# Patient Record
Sex: Male | Born: 1955
Health system: Southern US, Community
[De-identification: ages and names within clinical notes are randomized; demographics above are authoritative.]

## PROBLEM LIST (undated history)

## (undated) DIAGNOSIS — E119 Type 2 diabetes mellitus without complications: Secondary | ICD-10-CM

## (undated) DIAGNOSIS — I10 Essential (primary) hypertension: Secondary | ICD-10-CM

## (undated) DIAGNOSIS — G4733 Obstructive sleep apnea (adult) (pediatric): Secondary | ICD-10-CM

## (undated) HISTORY — PX: ELBOW SURGERY: SHX618

## (undated) HISTORY — PX: EYE SURGERY: SHX253

## (undated) HISTORY — PX: SPINE SURGERY: SHX786

---

## 2004-06-27 ENCOUNTER — Ambulatory Visit: Payer: Self-pay | Admitting: Internal Medicine

## 2008-12-07 ENCOUNTER — Ambulatory Visit: Payer: Self-pay | Admitting: Gastroenterology

## 2009-01-03 ENCOUNTER — Encounter: Payer: Self-pay | Admitting: Internal Medicine

## 2009-02-01 ENCOUNTER — Encounter: Payer: Self-pay | Admitting: Internal Medicine

## 2011-05-22 ENCOUNTER — Ambulatory Visit: Payer: Self-pay | Admitting: Internal Medicine

## 2011-11-20 ENCOUNTER — Ambulatory Visit: Payer: Self-pay | Admitting: Gastroenterology

## 2011-12-21 ENCOUNTER — Emergency Department: Payer: Self-pay | Admitting: Emergency Medicine

## 2015-11-02 DIAGNOSIS — F329 Major depressive disorder, single episode, unspecified: Secondary | ICD-10-CM | POA: Insufficient documentation

## 2015-11-02 DIAGNOSIS — F32A Depression, unspecified: Secondary | ICD-10-CM | POA: Insufficient documentation

## 2015-11-02 DIAGNOSIS — Z6841 Body Mass Index (BMI) 40.0 and over, adult: Secondary | ICD-10-CM

## 2015-11-08 DIAGNOSIS — E782 Mixed hyperlipidemia: Secondary | ICD-10-CM | POA: Insufficient documentation

## 2015-11-08 DIAGNOSIS — E119 Type 2 diabetes mellitus without complications: Secondary | ICD-10-CM | POA: Insufficient documentation

## 2015-11-30 DIAGNOSIS — S92143A Displaced dome fracture of unspecified talus, initial encounter for closed fracture: Secondary | ICD-10-CM | POA: Insufficient documentation

## 2015-11-30 DIAGNOSIS — S82892P Other fracture of left lower leg, subsequent encounter for closed fracture with malunion: Secondary | ICD-10-CM | POA: Insufficient documentation

## 2016-05-01 DIAGNOSIS — I10 Essential (primary) hypertension: Secondary | ICD-10-CM | POA: Insufficient documentation

## 2016-06-23 ENCOUNTER — Emergency Department
Admission: EM | Admit: 2016-06-23 | Discharge: 2016-06-23 | Disposition: A | Discharge status: Home / Self Care | Payer: Self-pay | Attending: Emergency Medicine | Admitting: Emergency Medicine

## 2016-06-23 ENCOUNTER — Encounter: Payer: Self-pay | Admitting: Emergency Medicine

## 2016-06-23 ENCOUNTER — Emergency Department: Payer: Self-pay

## 2016-06-23 DIAGNOSIS — I10 Essential (primary) hypertension: Secondary | ICD-10-CM | POA: Insufficient documentation

## 2016-06-23 DIAGNOSIS — Y929 Unspecified place or not applicable: Secondary | ICD-10-CM | POA: Insufficient documentation

## 2016-06-23 DIAGNOSIS — E119 Type 2 diabetes mellitus without complications: Secondary | ICD-10-CM | POA: Insufficient documentation

## 2016-06-23 DIAGNOSIS — Y9389 Activity, other specified: Secondary | ICD-10-CM | POA: Insufficient documentation

## 2016-06-23 DIAGNOSIS — S8391XA Sprain of unspecified site of right knee, initial encounter: Secondary | ICD-10-CM | POA: Insufficient documentation

## 2016-06-23 DIAGNOSIS — Y999 Unspecified external cause status: Secondary | ICD-10-CM | POA: Insufficient documentation

## 2016-06-23 DIAGNOSIS — W1789XA Other fall from one level to another, initial encounter: Secondary | ICD-10-CM | POA: Insufficient documentation

## 2016-06-23 HISTORY — DX: Essential (primary) hypertension: I10

## 2016-06-23 HISTORY — DX: Obstructive sleep apnea (adult) (pediatric): G47.33

## 2016-06-23 HISTORY — DX: Type 2 diabetes mellitus without complications: E11.9

## 2016-06-23 MED ORDER — MELOXICAM 15 MG PO TABS: 15.0000 mg | ORAL_TABLET | Freq: Every day | ORAL | 0 refills | Status: DC

## 2016-06-23 NOTE — ED Provider Notes (Signed)
[facility] Emergency Department Provider Note ____________________________________________  Time seen: Approximately 3:03 PM  I have reviewed the triage vital signs and the nursing notes.   HISTORY  Chief Complaint Knee Injury    HPI Adam Grant is a 60 y.o. male who presents to the emergency department for evaluation of right knee pain after a mechanical, non-syncopal fall yesterday while getting off of a lift truck. He states that he fell and both of his knees were bent underneath him. The right knee is most painful. He states that the pain has worsened over the past 24 hours. He has not taken anything for pain relief prior to arrival.  Past Medical History:  Diagnosis Date  . Diabetes mellitus without complication ([place])   . Hypertension   . Obstructive sleep apnea     There are no active problems to display for this patient.   History reviewed. No pertinent surgical history.  Prior to Admission medications   Medication Sig Start Date End Date Taking? Authorizing Provider  meloxicam (MOBIC) 15 MG tablet Take 1 tablet (15 mg total) by mouth daily. 06/23/16   Victorino Dike, FNP    Allergies Losartan; Penicillins; and Tetracyclines & related  History reviewed. No pertinent family history.  Social History Social History  Substance Use Topics  . Smoking status: Never Smoker  . Smokeless tobacco: Never Used  . Alcohol use Yes     Comment: ocass.     Review of Systems Constitutional: No recent illness. Cardiovascular: Denies chest pain or palpitations. Respiratory: Denies shortness of breath. Musculoskeletal: Pain in Right knee Skin: Negative for rash, wound, lesion. Neurological: Negative for focal weakness or numbness.  ____________________________________________   PHYSICAL EXAM:  VITAL SIGNS: ED Triage Vitals  Enc Vitals Group     BP 06/23/16 1431 132/68     Pulse Rate 06/23/16 1431 88     Resp 06/23/16 1431 16     Temp  06/23/16 1431 98.1 F (36.7 C)     Temp Source 06/23/16 1431 Oral     SpO2 06/23/16 1431 96 %     Weight 06/23/16 1432 (!) 314 lb (142.4 kg)     Height 06/23/16 1432 5\' 11"  (1.803 m)     Head Circumference --      Peak Flow --      Pain Score 06/23/16 1432 2     Pain Loc --      Pain Edu? --      Excl. in [place]? --     Constitutional: Alert and oriented. Well appearing and in no acute distress. Eyes: Conjunctivae are normal. EOMI. Head: Atraumatic. Neck: No stridor.  Respiratory: Normal respiratory effort.   Musculoskeletal: Full, active range of motion of the right knee. Mild edema diffusely over the knee without erythema or fluctuance. No crepitus on flexion or extension. No obvious deformity of the patella. He is able to perform straight leg raise without assistance. Neurologic:  Normal speech and language. No gross focal neurologic deficits are appreciated. Speech is normal. No gait instability. Skin:  Skin is warm, dry and intact. Atraumatic. Psychiatric: Mood and affect are normal. Speech and behavior are normal.  ____________________________________________   LABS (all labs ordered are listed, but only abnormal results are displayed)  Labs Reviewed - No data to display ____________________________________________  RADIOLOGY  Right knee x-ray negative for acute bony abnormality per radiology. ____________________________________________   PROCEDURES  Procedure(s) performed: None   ____________________________________________   INITIAL IMPRESSION / ASSESSMENT AND  PLAN / ED COURSE  Clinical Course    Pertinent labs & imaging results that were available during my care of the patient were reviewed by me and considered in my medical decision making (see chart for details).  Patient was encouraged to follow up with orthopedics for symptoms that are not improving over the week. He was instructed to take the meloxicam daily as prescribed. He was advised to rest, ice,  and elevate the right lower extremity throughout the day. He was advised to return to the emergency department for symptoms that change or worsen if he is unable to schedule an appointment. ____________________________________________   FINAL CLINICAL IMPRESSION(S) / ED DIAGNOSES  Final diagnoses:  Sprain of right knee, unspecified ligament, initial encounter       Victorino Dike, FNP 06/23/16 [place], MD 06/23/16 [id number]

## 2016-06-23 NOTE — ED Triage Notes (Signed)
Pt states he was getting out of a pickup truck and fell to his knees. C/o right knee pain worse when walking.

## 2016-06-23 NOTE — ED Notes (Signed)
Pt informed to follow up with ortho and to take medications.  Pt also informed by provider to obtain a knee sleeve or brace for additional support and comfort.

## 2016-06-23 NOTE — ED Notes (Signed)
Patient states he fell on a lift truck yesterday and c/o right knee pain. Pt states he also has a broken left ankle that is chronic that he has not been able to receive surgery for due to lack of insurance. Pt has extensive medical history which includes diabetes and venous stasis.  Pt having increased difficulty ambulating with knee pain.

## 2016-06-23 NOTE — Discharge Instructions (Signed)
Follow-up with orthopedics for symptoms that are not improving over the week. Rest, ice, and elevate your right leg often throughout the day. You may find it helpful to apply a knee brace or neoprene sleeve.   Return to the emergency department for symptoms that change or worsen if you're unable schedule an appointment with the primary care provider or the orthopedic specialist.

## 2016-11-05 DIAGNOSIS — M21072 Valgus deformity, not elsewhere classified, left ankle: Secondary | ICD-10-CM | POA: Insufficient documentation

## 2016-11-06 ENCOUNTER — Encounter: Payer: Medicare HMO | Attending: Family Medicine | Admitting: Dietician

## 2016-11-06 ENCOUNTER — Encounter: Payer: Self-pay | Admitting: Dietician

## 2016-11-06 VITALS — Ht 71.0 in | Wt 313.0 lb

## 2016-11-06 DIAGNOSIS — E119 Type 2 diabetes mellitus without complications: Secondary | ICD-10-CM | POA: Insufficient documentation

## 2016-11-06 DIAGNOSIS — IMO0001 Reserved for inherently not codable concepts without codable children: Secondary | ICD-10-CM

## 2016-11-06 DIAGNOSIS — D126 Benign neoplasm of colon, unspecified: Secondary | ICD-10-CM | POA: Insufficient documentation

## 2016-11-06 DIAGNOSIS — Z713 Dietary counseling and surveillance: Secondary | ICD-10-CM | POA: Insufficient documentation

## 2016-11-06 DIAGNOSIS — I82409 Acute embolism and thrombosis of unspecified deep veins of unspecified lower extremity: Secondary | ICD-10-CM | POA: Insufficient documentation

## 2016-11-06 NOTE — Patient Instructions (Signed)
   Eat smaller food portions, especially of starches and meats. Eat generous portions of vegetables.   Eat something every 3-5 hours during the day.   Start some light chair exercises 1 or more times during the day, such as arm lifts, air-punches, leg lifts to start building strength so you can at some point add pool exercise or other exercise.

## 2016-11-06 NOTE — Progress Notes (Signed)
Medical Nutrition Therapy: Visit start time: 1100  end time: 1200  Assessment:  Diagnosis: obesity Past medical history: Type 2 DM, HTN, HLD, sleep apnea, DVT Psychosocial issues/ stress concerns: history of depression, not currently.  Preferred learning method:  . Auditory . Visual . Hands-on  Current weight: 313lbs per patient report; declined weighing today  Height: 5'11" Medications, supplements: reconciled list in medical record.   Progress and evaluation: Patient reports long history of obesity, has worked to lose weight in the past by reducing some food portions and sugar. Reports some issue with constipation, uses ex-lax if no BM in 2-3 days. He wants to lose weight to improve mobility and decrease reliance on others for assistance. Currently patient's son and sometimes ex-wife will prepare supper meals for him.    Physical activity: none, limited mobility  Dietary Intake:  Usual eating pattern includes 2-3 meals and 0-2 snacks per day. Dining out frequency: 5-6 meals per week.  Breakfast: fruit 2 apples or oranges or 2-3 bananas, 2 protein bars, 3 yogurt cups, 2 breakfast on a stick Snack: none Lunch: fast food maybe 2x a week; chinese; burrito, sometimes no lunch depending on when breakfast was eaten.  Snack: occasionally saltines or tollhouse cookies, rarely soup Supper: sometimes as late as 8-9pm (others bring food to patient)--chicken, chicken fingers, sometimes pizza; limits breads. Sometimes dry cereal with lowfat milk. Some salads Snack: none or nuts Beverages: water, limits alcohol, has consumed some sports drinks such as propel  Nutrition Care Education: Topics covered: weight management, diabetes Basic nutrition: basic food groups, appropriate nutrient balance, appropriate meal and snack schedule, general nutrition guidelines    Weight control: benefits of weight control, determining reasonable weight goal, behavioral changes for weight loss--strategies for portion  control including measuring portions, eating slowly, using small plates, avoiding seconds. Instructed on basic meal planning for 1700kcal daily intake using plate method and food models. Provided sample easy menus. Discussed importance of limiting high fat and high sugar foods as well as added sugars and fats. Discussed role of physical activity in weight loss and options for light exercise.  Diabetes: appropriate meal and snack schedule, appropriate carb intake and balance   Nutritional Diagnosis:  [place]-3.3 Overweight/obesity As related to excess calories, limited mobility.  As evidenced by BMI 43, patient report.  Intervention: Instruction as noted above.   Patient agrees to work further on controlling food portions and eating at regular intervals.    Set goals with input from patient.    Patient declined scheduling follow-up at this time, but will schedule later if needed.  Education Materials given:  . Food lists/ Planning A Balanced Meal . Sample meal pattern/ menus . Snacking handout . Goals/ instructions  Learner/ who was taught:  . Patient  . Family member: son  Level of understanding: Marland Kitchen Verbalizes/ demonstrates competency  Demonstrated degree of understanding via:   Teach back Learning barriers: . None  Willingness to learn/ readiness for change: . Eager, change in progress  Monitoring and Evaluation:  Dietary intake, exercise, and body weight      follow up: prn

## 2017-03-01 ENCOUNTER — Encounter (INDEPENDENT_AMBULATORY_CARE_PROVIDER_SITE_OTHER): Payer: Medicare HMO | Admitting: Vascular Surgery

## 2017-03-04 ENCOUNTER — Encounter (INDEPENDENT_AMBULATORY_CARE_PROVIDER_SITE_OTHER): Payer: Self-pay | Admitting: Vascular Surgery

## 2018-12-26 ENCOUNTER — Other Ambulatory Visit (HOSPITAL_COMMUNITY): Payer: Self-pay | Admitting: Nephrology

## 2018-12-26 ENCOUNTER — Other Ambulatory Visit: Payer: Self-pay | Admitting: Nephrology

## 2018-12-26 DIAGNOSIS — N183 Chronic kidney disease, stage 3 unspecified: Secondary | ICD-10-CM

## 2019-02-10 ENCOUNTER — Ambulatory Visit (HOSPITAL_COMMUNITY): Payer: Medicare HMO

## 2019-02-10 ENCOUNTER — Encounter (HOSPITAL_COMMUNITY): Payer: Self-pay

## 2019-02-12 ENCOUNTER — Ambulatory Visit (HOSPITAL_COMMUNITY): Admission: RE | Admit: 2019-02-12 | Payer: Medicare HMO | Source: Ambulatory Visit

## 2019-02-16 ENCOUNTER — Ambulatory Visit (HOSPITAL_COMMUNITY)
Admission: RE | Admit: 2019-02-16 | Discharge: 2019-02-16 | Disposition: A | Discharge status: Home / Self Care | Payer: Medicare HMO | Source: Ambulatory Visit | Attending: Nephrology | Admitting: Nephrology

## 2019-02-16 ENCOUNTER — Other Ambulatory Visit: Payer: Self-pay

## 2019-02-16 DIAGNOSIS — N183 Chronic kidney disease, stage 3 unspecified: Secondary | ICD-10-CM

## 2020-02-19 ENCOUNTER — Ambulatory Visit (INDEPENDENT_AMBULATORY_CARE_PROVIDER_SITE_OTHER): Payer: Medicare Other

## 2020-02-19 ENCOUNTER — Encounter: Payer: Self-pay | Admitting: Emergency Medicine

## 2020-02-19 ENCOUNTER — Ambulatory Visit
Admission: EM | Admit: 2020-02-19 | Discharge: 2020-02-19 | Disposition: A | Discharge status: Home / Self Care | Payer: Medicare Other | Attending: Emergency Medicine | Admitting: Emergency Medicine

## 2020-02-19 ENCOUNTER — Ambulatory Visit: Payer: Medicare Other

## 2020-02-19 DIAGNOSIS — R0602 Shortness of breath: Secondary | ICD-10-CM

## 2020-02-19 DIAGNOSIS — J189 Pneumonia, unspecified organism: Secondary | ICD-10-CM

## 2020-02-19 DIAGNOSIS — R05 Cough: Secondary | ICD-10-CM

## 2020-02-19 DIAGNOSIS — R058 Other specified cough: Secondary | ICD-10-CM

## 2020-02-19 MED ORDER — BENZONATATE 100 MG PO CAPS: 100.0000 mg | ORAL_CAPSULE | Freq: Three times a day (TID) | ORAL | 0 refills | Status: DC

## 2020-02-19 MED ORDER — PREDNISONE 10 MG PO TABS: 20.0000 mg | ORAL_TABLET | Freq: Every day | ORAL | 0 refills | Status: AC

## 2020-02-19 NOTE — Discharge Instructions (Signed)
Get plenty of rest and push fluids Tessalon Perles prescribed for cough Take prednisone prescribed and to completion Use medications daily for symptom relief Use OTC medications like ibuprofen or tylenol as needed fever or pain Call or go to the ED if you have any new or worsening symptoms such as fever, worsening cough, shortness of breath, chest tightness, chest pain, turning blue, changes in mental status, etc..Marland Kitchen

## 2020-02-19 NOTE — ED Triage Notes (Signed)
Productive cough yellowish color x 30month.  pcp wanted pt to have chest xray

## 2020-02-19 NOTE — ED Provider Notes (Signed)
Adam Grant   [id number] 02/19/20 Arrival Time: 72   Chief Complaint  Patient presents with  . Cough    SUBJECTIVE: History from: patient.  Adam Grant is a 64 y.o. male who presents to the urgent care with a complaint of yellowish  Green productive cough for the past 1 month.  Was instructed by PCP to have the x-ray completed.  Patient reported he has COVID-19 test completed and has received his second Covid immunization.  Denies sick exposure to COVID, flu or strep.  Denies recent travel.  Has tried OTC medication without relief.  Denies previous symptoms in the past.   Denies fever, chills, fatigue, sinus pain, rhinorrhea, sore throat, SOB, wheezing, chest pain, nausea, changes in bowel or bladder habits.      ROS: As per HPI.  All other pertinent ROS negative.      Past Medical History:  Diagnosis Date  . Diabetes mellitus without complication ([place])   . Hypertension   . Obstructive sleep apnea    History reviewed. No pertinent surgical history. Allergies  Allergen Reactions  . Losartan Swelling  . Penicillins Itching and Rash  . Tetracyclines & Related Rash   No current facility-administered medications on file prior to encounter.   Current Outpatient Medications on File Prior to Encounter  Medication Sig Dispense Refill  . amLODipine (NORVASC) 5 MG tablet Take by mouth.    Marland Kitchen aspirin EC 81 MG tablet Take by mouth.    Marland Kitchen atorvastatin (LIPITOR) 40 MG tablet Take by mouth.    . Cholecalciferol (VITAMIN D3 PO) Take by mouth.    . hydrochlorothiazide (HYDRODIURIL) 25 MG tablet Take by mouth.    . metFORMIN (GLUCOPHAGE) 1000 MG tablet Take by mouth in the morning and at bedtime.     . Multiple Vitamins-Minerals (COMPLETE) TABS Take by mouth.    Marland Kitchen acetaminophen (TYLENOL) 325 MG tablet Take by mouth.    . meloxicam (MOBIC) 15 MG tablet Take 1 tablet (15 mg total) by mouth daily. (Patient not taking: Reported on 11/06/2016) 30 tablet 0   Social History    Socioeconomic History  . Marital status: Divorced    Spouse name: Not on file  . Number of children: Not on file  . Years of education: Not on file  . Highest education level: Not on file  Occupational History  . Not on file  Tobacco Use  . Smoking status: Never Smoker  . Smokeless tobacco: Never Used  Substance and Sexual Activity  . Alcohol use: Yes    Comment: ocass.   . Drug use: No  . Sexual activity: Not on file  Other Topics Concern  . Not on file  Social History Narrative  . Not on file   Social Determinants of Health   Financial Resource Strain:   . Difficulty of Paying Living Expenses:   Food Insecurity:   . Worried About Charity fundraiser in the Last Year:   . Arboriculturist in the Last Year:   Transportation Needs:   . Film/video editor (Medical):   Marland Kitchen Lack of Transportation (Non-Medical):   Physical Activity:   . Days of Exercise per Week:   . Minutes of Exercise per Session:   Stress:   . Feeling of Stress :   Social Connections:   . Frequency of Communication with Friends and Family:   . Frequency of Social Gatherings with Friends and Family:   . Attends Religious Services:   . Active  Member of Clubs or Organizations:   . Attends Archivist Meetings:   Marland Kitchen Marital Status:   Intimate Partner Violence:   . Fear of Current or Ex-Partner:   . Emotionally Abused:   Marland Kitchen Physically Abused:   . Sexually Abused:    No family history on file.  OBJECTIVE:  Vitals:   02/19/20 1420 02/19/20 1422  BP:  (!) 147/89  Pulse:  77  Resp:  19  Temp:  97.9 F (36.6 C)  TempSrc:  Oral  SpO2:  93%  Weight: (!) 310 lb (140.6 kg)   Height: 5\' 11"  (1.803 m)      General appearance: alert and oriented; nontoxic; speaking in full sentences and tolerating own secretions HEENT: NCAT; Ears: EACs clear, TMs pearly gray; Eyes: PERRL.  EOM grossly intact. Sinuses: nontender; Nose: nares patent without rhinorrhea, Throat: oropharynx clear, tonsils non  erythematous or enlarged, uvula midline  Neck: supple without LAD Lungs: unlabored respirations, symmetrical air entry; cough: moderate; no respiratory distress; CTAB Heart: regular rate and rhythm.  Radial pulses 2+ symmetrical bilaterally Skin: warm and dry Psychological: alert and cooperative; normal mood and affect  LABS:  No results found for this or any previous visit (from the past 24 hour(s)).   ASSESSMENT & PLAN:  1. Productive cough   2. Pneumonitis     Meds ordered this encounter  Medications  . predniSONE (DELTASONE) 10 MG tablet    Sig: Take 2 tablets (20 mg total) by mouth daily for 10 days.    Dispense:  20 tablet    Refill:  0  . benzonatate (TESSALON) 100 MG capsule    Sig: Take 1 capsule (100 mg total) by mouth every 8 (eight) hours.    Dispense:  30 capsule    Refill:  0   Discharge Instructions Get plenty of rest and push fluids Tessalon Perles prescribed for cough Take prednisone prescribed and to completion Use medications daily for symptom relief Use OTC medications like ibuprofen or tylenol as needed fever or pain Call or go to the ED if you have any new or worsening symptoms such as fever, worsening cough, shortness of breath, chest tightness, chest pain, turning blue, changes in mental status, etc...   Reviewed expectations re: course of current medical issues. Questions answered. Outlined signs and symptoms indicating need for more acute intervention. Patient verbalized understanding. After Visit Summary given.       Note: This document was prepared using Dragon voice recognition software and may include unintentional dictation errors.    Emerson Monte, [place] 02/19/20 1517

## 2020-07-08 DIAGNOSIS — N200 Calculus of kidney: Secondary | ICD-10-CM | POA: Diagnosis not present

## 2020-07-08 DIAGNOSIS — R3121 Asymptomatic microscopic hematuria: Secondary | ICD-10-CM | POA: Diagnosis not present

## 2020-07-20 DIAGNOSIS — Z23 Encounter for immunization: Secondary | ICD-10-CM | POA: Diagnosis not present

## 2020-07-20 DIAGNOSIS — E782 Mixed hyperlipidemia: Secondary | ICD-10-CM | POA: Diagnosis not present

## 2020-07-20 DIAGNOSIS — E118 Type 2 diabetes mellitus with unspecified complications: Secondary | ICD-10-CM | POA: Diagnosis not present

## 2020-07-20 DIAGNOSIS — I878 Other specified disorders of veins: Secondary | ICD-10-CM | POA: Diagnosis not present

## 2020-07-20 DIAGNOSIS — I10 Essential (primary) hypertension: Secondary | ICD-10-CM | POA: Diagnosis not present

## 2020-09-16 DIAGNOSIS — G4730 Sleep apnea, unspecified: Secondary | ICD-10-CM | POA: Diagnosis not present

## 2020-09-16 DIAGNOSIS — G4733 Obstructive sleep apnea (adult) (pediatric): Secondary | ICD-10-CM | POA: Diagnosis not present

## 2020-10-06 DIAGNOSIS — E119 Type 2 diabetes mellitus without complications: Secondary | ICD-10-CM | POA: Diagnosis not present

## 2020-10-06 DIAGNOSIS — H524 Presbyopia: Secondary | ICD-10-CM | POA: Diagnosis not present

## 2020-10-06 DIAGNOSIS — H2513 Age-related nuclear cataract, bilateral: Secondary | ICD-10-CM | POA: Diagnosis not present

## 2020-10-06 DIAGNOSIS — H5203 Hypermetropia, bilateral: Secondary | ICD-10-CM | POA: Diagnosis not present

## 2020-10-06 DIAGNOSIS — Z7984 Long term (current) use of oral hypoglycemic drugs: Secondary | ICD-10-CM | POA: Diagnosis not present

## 2020-10-13 DIAGNOSIS — G4733 Obstructive sleep apnea (adult) (pediatric): Secondary | ICD-10-CM | POA: Diagnosis not present

## 2020-10-13 DIAGNOSIS — E1122 Type 2 diabetes mellitus with diabetic chronic kidney disease: Secondary | ICD-10-CM | POA: Diagnosis not present

## 2020-10-13 DIAGNOSIS — G959 Disease of spinal cord, unspecified: Secondary | ICD-10-CM | POA: Diagnosis not present

## 2020-10-13 DIAGNOSIS — E119 Type 2 diabetes mellitus without complications: Secondary | ICD-10-CM | POA: Diagnosis not present

## 2020-10-13 DIAGNOSIS — E118 Type 2 diabetes mellitus with unspecified complications: Secondary | ICD-10-CM | POA: Diagnosis not present

## 2020-10-13 DIAGNOSIS — G4734 Idiopathic sleep related nonobstructive alveolar hypoventilation: Secondary | ICD-10-CM | POA: Diagnosis not present

## 2020-10-13 DIAGNOSIS — N183 Chronic kidney disease, stage 3 unspecified: Secondary | ICD-10-CM | POA: Diagnosis not present

## 2020-11-09 DIAGNOSIS — I739 Peripheral vascular disease, unspecified: Secondary | ICD-10-CM | POA: Diagnosis not present

## 2020-11-09 DIAGNOSIS — E1169 Type 2 diabetes mellitus with other specified complication: Secondary | ICD-10-CM | POA: Diagnosis not present

## 2020-11-09 DIAGNOSIS — N183 Chronic kidney disease, stage 3 unspecified: Secondary | ICD-10-CM | POA: Diagnosis not present

## 2020-11-09 DIAGNOSIS — J449 Chronic obstructive pulmonary disease, unspecified: Secondary | ICD-10-CM | POA: Diagnosis not present

## 2020-11-09 DIAGNOSIS — G9589 Other specified diseases of spinal cord: Secondary | ICD-10-CM | POA: Diagnosis not present

## 2020-11-09 DIAGNOSIS — E1165 Type 2 diabetes mellitus with hyperglycemia: Secondary | ICD-10-CM | POA: Diagnosis not present

## 2020-11-09 LAB — HEMOGLOBIN A1C: Hemoglobin A1C: 6.7

## 2020-11-09 LAB — BASIC METABOLIC PANEL
BUN: 23 — AB (ref 4–21)
Creatinine: 1.8 — AB (ref 0.6–1.3)

## 2020-11-09 LAB — HEPATIC FUNCTION PANEL: Alkaline Phosphatase: 131 — AB (ref 25–125)

## 2020-11-09 LAB — COMPREHENSIVE METABOLIC PANEL: GFR calc non Af Amer: 40

## 2020-11-14 DIAGNOSIS — G4733 Obstructive sleep apnea (adult) (pediatric): Secondary | ICD-10-CM | POA: Diagnosis not present

## 2020-11-14 DIAGNOSIS — G4734 Idiopathic sleep related nonobstructive alveolar hypoventilation: Secondary | ICD-10-CM | POA: Diagnosis not present

## 2020-11-15 DIAGNOSIS — Z0000 Encounter for general adult medical examination without abnormal findings: Secondary | ICD-10-CM | POA: Diagnosis not present

## 2020-11-15 DIAGNOSIS — E1122 Type 2 diabetes mellitus with diabetic chronic kidney disease: Secondary | ICD-10-CM | POA: Diagnosis not present

## 2020-11-15 DIAGNOSIS — E118 Type 2 diabetes mellitus with unspecified complications: Secondary | ICD-10-CM | POA: Diagnosis not present

## 2020-11-15 DIAGNOSIS — E782 Mixed hyperlipidemia: Secondary | ICD-10-CM | POA: Diagnosis not present

## 2020-11-15 DIAGNOSIS — I129 Hypertensive chronic kidney disease with stage 1 through stage 4 chronic kidney disease, or unspecified chronic kidney disease: Secondary | ICD-10-CM | POA: Diagnosis not present

## 2020-11-15 DIAGNOSIS — N183 Chronic kidney disease, stage 3 unspecified: Secondary | ICD-10-CM | POA: Diagnosis not present

## 2020-11-16 ENCOUNTER — Encounter: Payer: Self-pay | Admitting: Urology

## 2020-11-16 ENCOUNTER — Ambulatory Visit (INDEPENDENT_AMBULATORY_CARE_PROVIDER_SITE_OTHER): Payer: Medicare Other | Admitting: Urology

## 2020-11-16 ENCOUNTER — Other Ambulatory Visit: Payer: Self-pay

## 2020-11-16 VITALS — BP 148/76 | HR 82 | Temp 98.1°F | Ht 71.0 in | Wt 319.0 lb

## 2020-11-16 DIAGNOSIS — N5089 Other specified disorders of the male genital organs: Secondary | ICD-10-CM | POA: Diagnosis not present

## 2020-11-16 LAB — URINALYSIS, ROUTINE W REFLEX MICROSCOPIC
Bilirubin, UA: NEGATIVE
Ketones, UA: NEGATIVE
Leukocytes,UA: NEGATIVE
Nitrite, UA: NEGATIVE
Specific Gravity, UA: 1.025 (ref 1.005–1.030)
Urobilinogen, Ur: 0.2 mg/dL (ref 0.2–1.0)
pH, UA: 5.5 (ref 5.0–7.5)

## 2020-11-16 LAB — MICROSCOPIC EXAMINATION
Renal Epithel, UA: NONE SEEN /hpf
WBC, UA: NONE SEEN /hpf (ref 0–5)

## 2020-11-16 MED ORDER — SULFAMETHOXAZOLE-TRIMETHOPRIM 800-160 MG PO TABS: 1.0000 | ORAL_TABLET | Freq: Two times a day (BID) | ORAL | 0 refills | Status: DC

## 2020-11-16 NOTE — Progress Notes (Signed)
Urological Symptom Review  Patient is experiencing the following symptoms: Frequent urination Get up at night to urinate Erection problems (male only) Penile pain (male only)  Kidney stones  Review of Systems  Gastrointestinal (upper)  : Negative for upper GI symptoms  Gastrointestinal (lower) : Negative for lower GI symptoms  Constitutional : Negative for symptoms  Skin: Skin rash/lesion  Eyes: Negative for eye symptoms  Ear/Nose/Throat : Sinus problems  Hematologic/Lymphatic: Negative for Hematologic/Lymphatic symptoms  Cardiovascular : Leg swelling  Respiratory : Negative for respiratory symptoms  Endocrine: Negative for endocrine symptoms  Musculoskeletal: Negative for musculoskeletal symptoms  Neurological: Negative for neurological symptoms  Psychologic: Negative for psychiatric symptoms

## 2020-11-16 NOTE — Patient Instructions (Signed)
Varicocele  A varicocele is a swelling of veins in the scrotum. The scrotum is the sac that contains the testicles. Varicoceles can occur on either side of the scrotum, but they are more common on the left side. They occur most often in teenage boys and young men. In most cases, varicoceles are not a serious problem. They are usually small and painless and do not require treatment. Tests may be done to confirm the diagnosis. Treatment may be needed if:  A varicocele is large, causes a lot of pain, or causes pain when exercising.  Varicoceles are found on both sides of the scrotum.  A varicocele causes a decrease in the size of the testicle in a growing adolescent.  The person has fertility problems. What are the causes? This condition is the result of valves in the veins not working properly. Valves in the veins help to return blood from the scrotum and testicles to the heart. If these valves do not work well, blood flows backward and backs up into the veins, which causes the veins to swell. This is similar to what happens when varicose veins form in the leg. What are the signs or symptoms? Most varicoceles do not cause any symptoms. If symptoms do occur, they may include:  Swelling on one side of the scrotum. The swelling may be more obvious when you are standing up.  A lumpy feeling in the scrotum.  A heavy feeling on one side of the scrotum.  A dull ache in the scrotum, especially after exercise or prolonged standing or sitting.  Slower growth or reduced size of the testicle on the side of the varicocele (in young males).  Problems with fathering a child (fertility). This can occur if the testicle does not grow normally or if the condition causes problems with the sperm, such as a low sperm count or sperm that are not able to reach the egg (poor motility). How is this diagnosed? This condition is diagnosed based on:  Your medical history.  A physical exam. Your health care  provider may inspect and feel (palpate) the scrotal area to check for swollen or enlarged veins.  An ultrasound. This may be done to confirm the diagnosis and to help rule out other causes of the swelling. How is this treated? Treatment is usually not needed for this condition. If you have any pain, your health care provider may prescribe or recommend medicine to help relieve it. You may need regular exams so your health care provider can monitor the varicocele to ensure that it does not cause problems. When further treatment is needed, it may involve one of these options:  Varicocelectomy. This is a surgery in which the swollen veins are tied off so that the flow of blood goes to other veins instead.  Embolization. In this procedure, a small, thin tube (catheter) is used to place metal coils or other blocking items in the veins. This cuts off the blood flow to the swollen veins. Follow these instructions at home:  Take over-the-counter and prescription medicines only as told by your health care provider.  Wear supportive underwear.  Use an athletic supporter when participating in sports activities.  Keep all follow-up visits as told by your health care provider. This is important. Contact a health care provider if:  Your pain is increasing.  You have redness in the affected area.  Your testicle becomes enlarged, swollen, or painful.  You have swelling that does not decrease when you are lying down.    One of your testicles is smaller than the other. Get help right away if:  You develop swelling in your legs.  You have difficulty breathing. Summary  Varicocele is a condition in which the veins in the scrotum are swollen or enlarged.  In most cases, varicoceles do not require treatment.  Treatment may be needed if you have pain, have problems with infertility, or have a smaller testicle associated with the varicocele.  In some cases, the condition may be treated with a  procedure to cut off the flow of blood to the swollen veins. This information is not intended to replace advice given to you by your health care provider. Make sure you discuss any questions you have with your health care provider. Document Revised: 11/07/2018 Document Reviewed: 11/07/2018 Elsevier Patient Education  2021 [employer]  

## 2020-11-16 NOTE — Progress Notes (Signed)
11/16/2020 2:01 PM   Adam Grant [birth year] [id number]  Referring provider: Bonnita Hollow, MD Adam Grant,  Adam Grant [id number]  Testicular mass  HPI: Adam Grant is a [phone] here for evaluation of a right testicular mass. He noted a mass 2-3 months ago and Grant the time it was causing him pain. The pain has now decreased and is minimal. No history of trauma. He underwent scrotal US on 10/20/2020 Grant Adam Grant which showed a solid epididymal mass. No other complaints today   PMH: Past Medical History:  Diagnosis Date  . Diabetes mellitus without complication (Adam Grant)   . Hypertension   . Obstructive sleep apnea     Surgical History: History reviewed. No pertinent surgical history.  Home Medications:  Allergies as of 11/16/2020      Reactions   Losartan Swelling, Palpitations, Rash   Penicillins Itching, Rash   Tetracyclines & Related Rash      Medication List       Accurate as of November 16, 2020  2:01 PM. If you have any questions, ask your nurse or doctor.        STOP taking these medications   metFORMIN 1000 MG tablet Commonly known as: GLUCOPHAGE Stopped by: Adam Bang, MD     TAKE these medications   acetaminophen 325 MG tablet Commonly known as: TYLENOL Take by mouth.   amLODipine 5 MG tablet Commonly known as: NORVASC Take by mouth.   aspirin EC 81 MG tablet Take by mouth.   atorvastatin 40 MG tablet Commonly known as: LIPITOR Take by mouth.   benzonatate 100 MG capsule Commonly known as: TESSALON Take 1 capsule (100 mg total) by mouth every 8 (eight) hours.   Complete Tabs Take by mouth.   hydrochlorothiazide 25 MG tablet Commonly known as: HYDRODIURIL Take by mouth.   meloxicam 15 MG tablet Commonly known as: MOBIC Take 1 tablet (15 mg total) by mouth daily.   Tradjenta 5 MG Tabs tablet Generic drug: linagliptin Take 5 mg by mouth daily.   VITAMIN D3 PO Take by mouth.       Allergies:  Allergies  Allergen Reactions   . Losartan Swelling, Palpitations and Rash  . Penicillins Itching and Rash  . Tetracyclines & Related Rash    Family History: History reviewed. No pertinent family history.  Social History:  reports that he has never smoked. He has never used smokeless tobacco. He reports current alcohol use. He reports that he does not use drugs.  ROS: All other review of systems were reviewed and are negative except what is noted above in HPI  Physical Exam: BP (!) 148/76   Pulse 82   Temp 98.1 F (36.7 C)   Ht 5\' 11"  (1.803 m)   Wt (!) 319 lb (144.7 kg)   BMI 44.49 kg/m   Constitutional:  Alert and oriented, No acute distress. HEENT: Adam Grant, moist mucus membranes.  Trachea midline, no masses. Cardiovascular: No clubbing, cyanosis, or edema. Respiratory: Normal respiratory effort, no increased work of breathing. GI: Abdomen is soft, nontender, nondistended, no abdominal masses GU: No CVA tenderness. Circumcised phallus. No masses/lesions on left testis, epididymis or scrotum. Right 33mm firm epididymal head mass. Mild pain with palpation.  Lymph: No cervical or inguinal lymphadenopathy. Skin: No rashes, bruises or suspicious lesions. Neurologic: Grossly intact, no focal deficits, moving all 4 extremities. Psychiatric: Normal mood and affect.  Laboratory Data: No results found for: WBC, HGB, HCT, MCV, PLT  No results  found for: CREATININE  No results found for: PSA  No results found for: TESTOSTERONE  No results found for: HGBA1C  Urinalysis No results found for: COLORURINE, APPEARANCEUR, LABSPEC, PHURINE, GLUCOSEU, HGBUR, BILIRUBINUR, KETONESUR, PROTEINUR, UROBILINOGEN, NITRITE, LEUKOCYTESUR  No results found for: LABMICR, [place], RBCUA, LABEPIT, MUCUS, BACTERIA  Pertinent Imaging: Scrotal US 10/20/2020: Images reviewed and discussed with the patient No results found for this or any previous visit.  No results found for this or any previous visit.  No results found for this or  any previous visit.  No results found for this or any previous visit.  Results for orders placed during the hospital encounter of 02/16/19  US RENAL  Narrative CLINICAL DATA:  Chronic renal disease.  EXAM: RENAL / URINARY TRACT ULTRASOUND COMPLETE  COMPARISON:  No prior.  FINDINGS: Right Kidney:  Renal measurements: 13.5 x 6.1 x 6.0 cm = volume: 259.5 mL . Echogenicity within normal limits. No mass or hydronephrosis visualized.  Left Kidney:  Renal measurements: 13.9 x 5.0 x 5.5 cm = volume: 197.8 mL. Echogenicity within normal limits. No mass or hydronephrosis visualized. 10.4 mm stone.  Bladder:  Appears normal for degree of bladder distention.  IMPRESSION: 1.  10.4 mm stone left kidney.  2.  Exam otherwise unremarkable.   Electronically Signed By: Marcello Moores  Register On: 02/17/2019 08:09  No results found for this or any previous visit.  No results found for this or any previous visit.  No results found for this or any previous visit.   Assessment & Plan:    1. Testicular mass -scrotal US, will call with results - Urinalysis, Routine w reflex microscopic   No follow-ups on file.  Adam Bang, MD  [facility]

## 2020-11-30 DIAGNOSIS — E1169 Type 2 diabetes mellitus with other specified complication: Secondary | ICD-10-CM | POA: Diagnosis not present

## 2020-11-30 DIAGNOSIS — E1122 Type 2 diabetes mellitus with diabetic chronic kidney disease: Secondary | ICD-10-CM | POA: Diagnosis not present

## 2020-11-30 DIAGNOSIS — J449 Chronic obstructive pulmonary disease, unspecified: Secondary | ICD-10-CM | POA: Diagnosis not present

## 2020-11-30 DIAGNOSIS — N183 Chronic kidney disease, stage 3 unspecified: Secondary | ICD-10-CM | POA: Diagnosis not present

## 2020-12-09 IMAGING — US US RENAL
1 series · 14 of 25 positions shown · non-contrast
Comparison: No prior.

CLINICAL DATA: Chronic renal disease.

EXAM:
RENAL / URINARY TRACT ULTRASOUND COMPLETE

[Series 1: us renal · 14 of 40 slices shown]
[im 1/40]
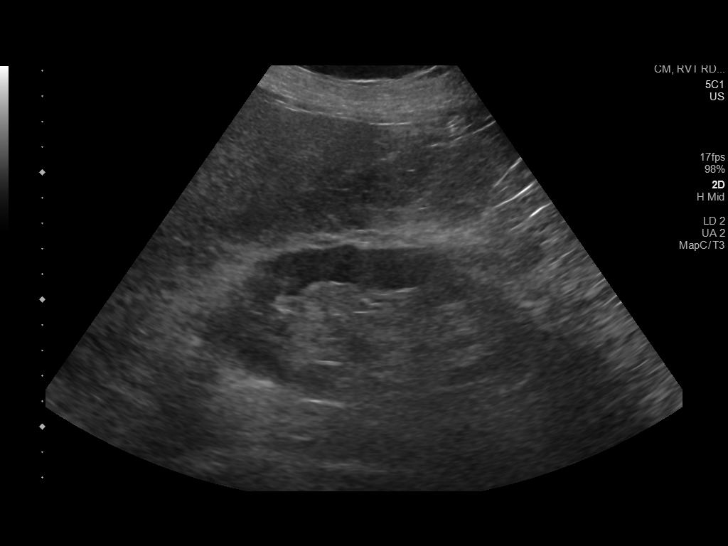
[im 4/40]
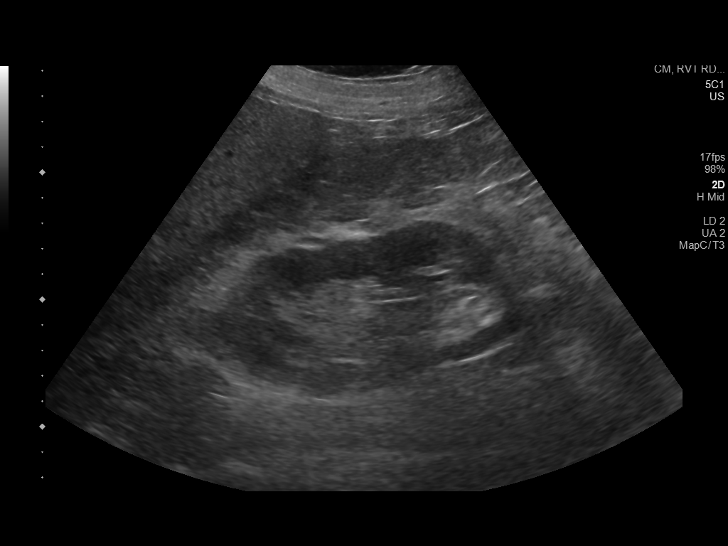
[im 7/40]
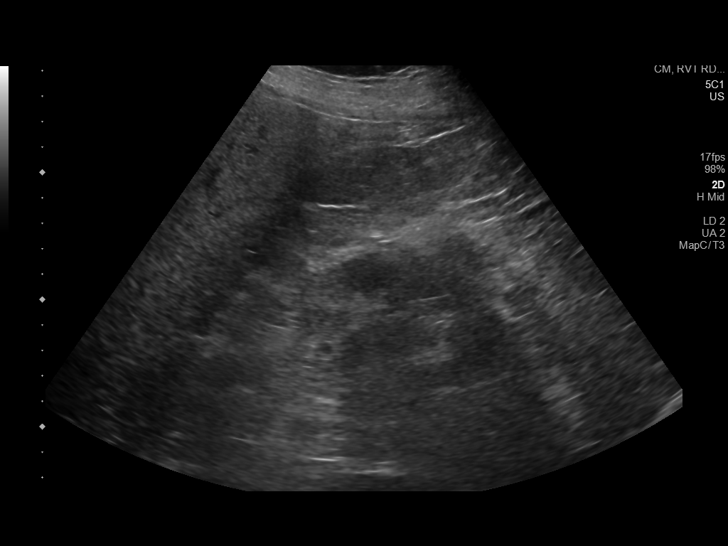
[im 10/40]
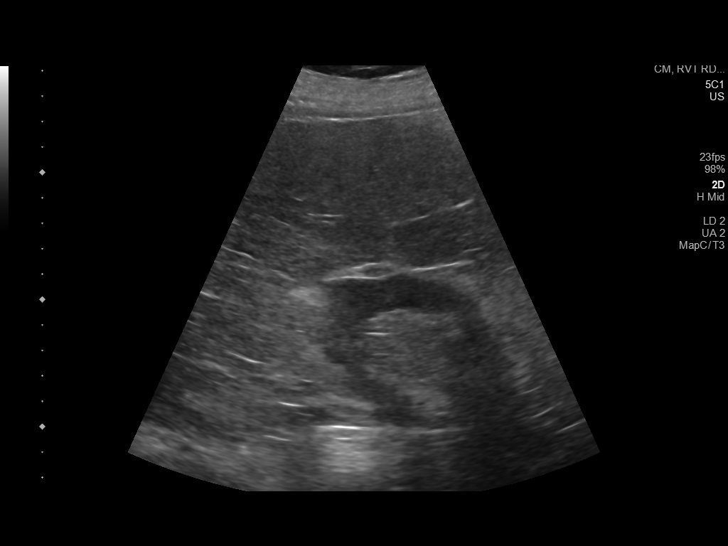
[im 14/40]
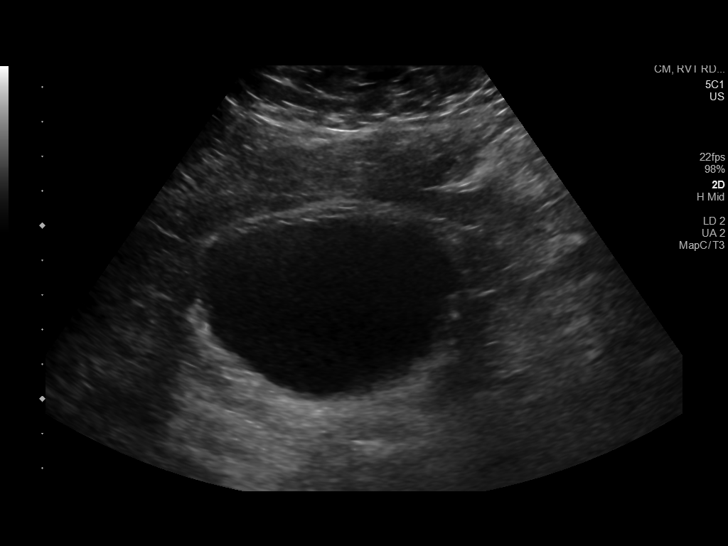
[im 15/40]
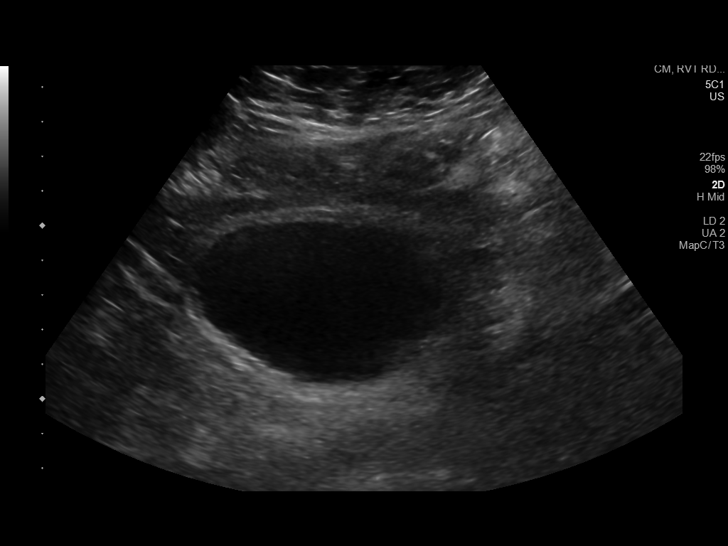
[im 18/40]
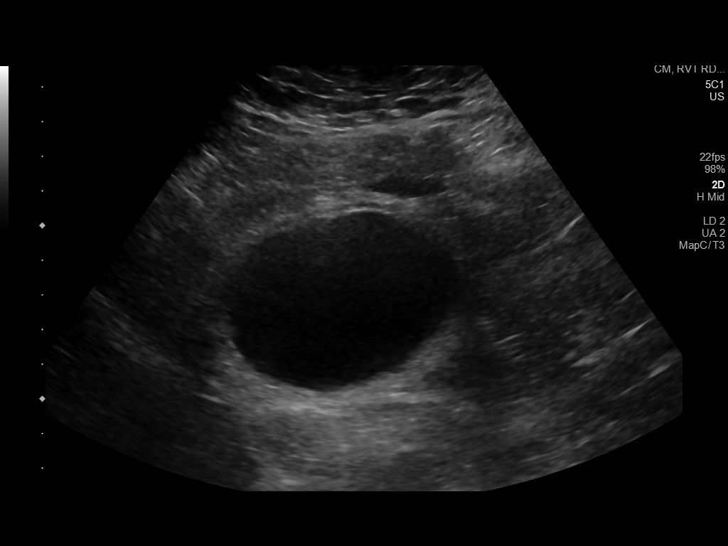
[im 22/40]
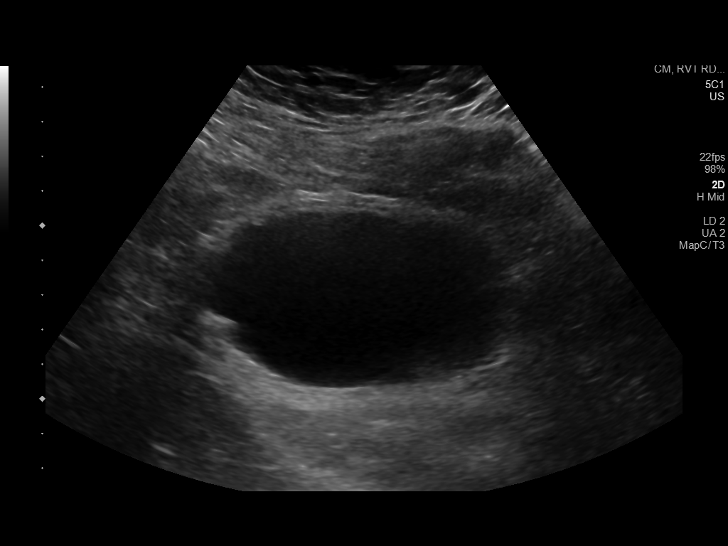
[im 25/40]
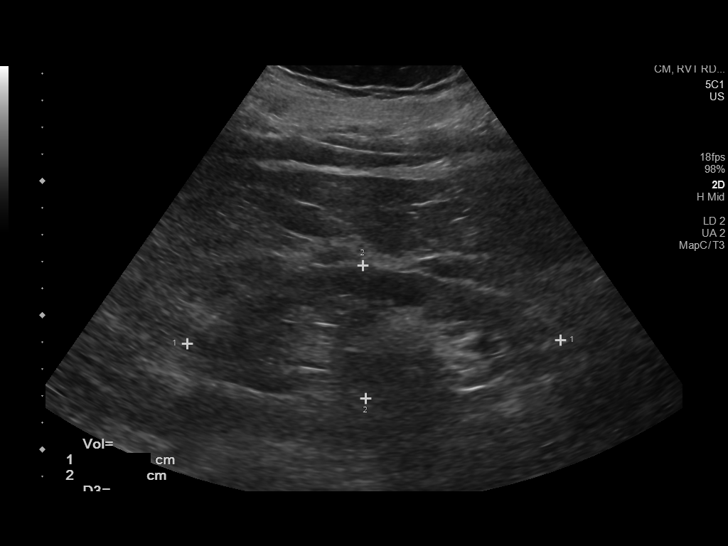
[im 27/40]
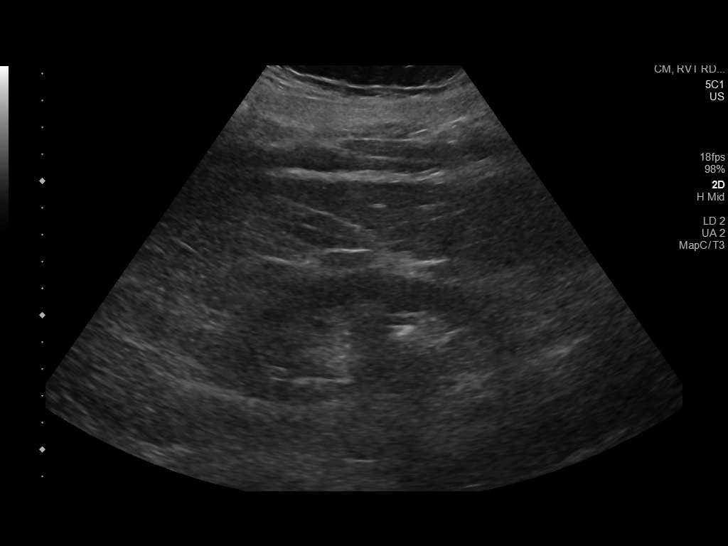
[im 30/40]
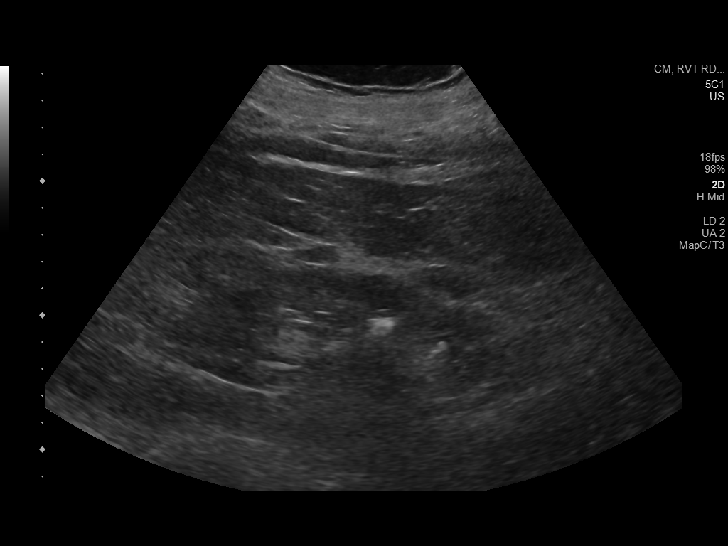
[im 33/40]
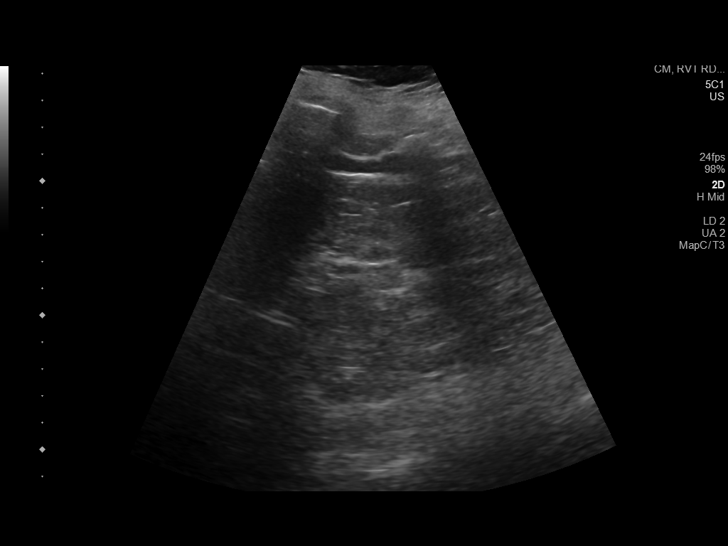
[im 36/40]
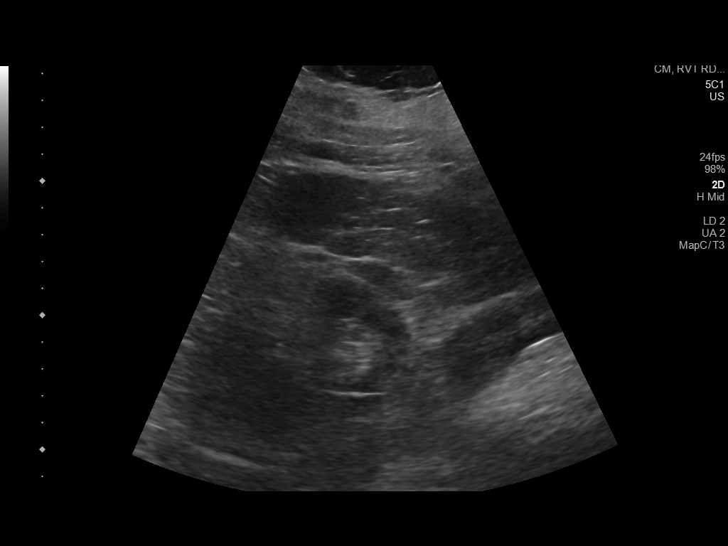
[im 40/40]
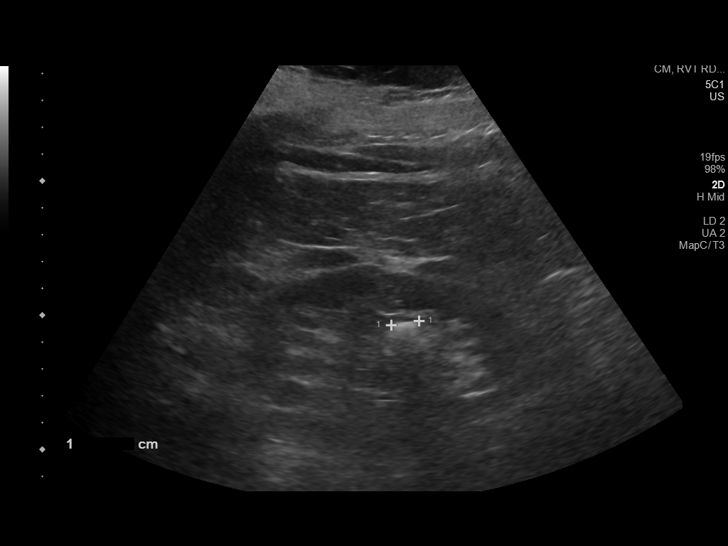

[14 of 25 positions shown; findings below may reference images not displayed]

FINDINGS: Right Kidney:

Renal measurements: 13.5 x 6.1 x 6.0 cm = volume: 259.5 mL .
Echogenicity within normal limits. No mass or hydronephrosis
visualized.

Left Kidney:

Renal measurements: 13.9 x 5.0 x 5.5 cm = volume: 197.8 mL.
Echogenicity within normal limits. No mass or hydronephrosis
visualized. 10.4 mm stone.

Bladder:

Appears normal for degree of bladder distention.
IMPRESSION: 1.  10.4 mm stone left kidney.

2.  Exam otherwise unremarkable.

## 2020-12-13 DIAGNOSIS — E1169 Type 2 diabetes mellitus with other specified complication: Secondary | ICD-10-CM | POA: Diagnosis not present

## 2020-12-13 DIAGNOSIS — J449 Chronic obstructive pulmonary disease, unspecified: Secondary | ICD-10-CM | POA: Diagnosis not present

## 2020-12-13 DIAGNOSIS — C719 Malignant neoplasm of brain, unspecified: Secondary | ICD-10-CM | POA: Diagnosis not present

## 2020-12-13 DIAGNOSIS — E1122 Type 2 diabetes mellitus with diabetic chronic kidney disease: Secondary | ICD-10-CM | POA: Diagnosis not present

## 2020-12-13 DIAGNOSIS — Z1389 Encounter for screening for other disorder: Secondary | ICD-10-CM | POA: Diagnosis not present

## 2020-12-14 ENCOUNTER — Ambulatory Visit (HOSPITAL_COMMUNITY)
Admission: RE | Admit: 2020-12-14 | Discharge: 2020-12-14 | Disposition: A | Discharge status: Home / Self Care | Payer: Medicare Other | Source: Ambulatory Visit | Attending: Urology | Admitting: Urology

## 2020-12-14 DIAGNOSIS — N5089 Other specified disorders of the male genital organs: Secondary | ICD-10-CM | POA: Insufficient documentation

## 2020-12-22 DIAGNOSIS — R3121 Asymptomatic microscopic hematuria: Secondary | ICD-10-CM | POA: Diagnosis not present

## 2020-12-22 DIAGNOSIS — N183 Chronic kidney disease, stage 3 unspecified: Secondary | ICD-10-CM | POA: Diagnosis not present

## 2020-12-23 ENCOUNTER — Telehealth: Payer: Self-pay

## 2020-12-23 NOTE — Telephone Encounter (Signed)
Attempted to call pt to give results. Left message to return call.

## 2020-12-23 NOTE — Telephone Encounter (Signed)
-----   Message from Cleon Gustin, MD sent at 12/23/2020  1:22 PM EDT ----- Normal scrotal US ----- Message ----- From: Valentina Lucks, LPN Sent: 02/07/3015   1:28 PM EDT To: Cleon Gustin, MD  Pls review.

## 2020-12-26 ENCOUNTER — Other Ambulatory Visit: Payer: Self-pay

## 2020-12-26 ENCOUNTER — Ambulatory Visit (INDEPENDENT_AMBULATORY_CARE_PROVIDER_SITE_OTHER): Payer: Medicare Other | Admitting: Urology

## 2020-12-26 ENCOUNTER — Encounter: Payer: Self-pay | Admitting: Urology

## 2020-12-26 VITALS — BP 135/76 | HR 70 | Temp 97.8°F | Ht 71.0 in | Wt 322.0 lb

## 2020-12-26 DIAGNOSIS — N5089 Other specified disorders of the male genital organs: Secondary | ICD-10-CM

## 2020-12-26 NOTE — Patient Instructions (Signed)
Epididymitis  Epididymitis is swelling (inflammation) or infection of the epididymis. The epididymis is a cord-like structure that is located along the top and back part of the testicle. It collects and stores sperm from the testicle. This condition can also cause pain and swelling of the testicle and scrotum. Symptoms usually start suddenly (acute epididymitis). Sometimes epididymitis starts gradually and lasts for a while (chronic epididymitis). This type may be harder to treat. What are the causes? In men ages 20-40, this condition is usually caused by a bacterial infection or a sexually transmitted disease (STD), such as:  Gonorrhea.  Chlamydia. In men 40 and older who do not have anal sex, this condition is usually caused by bacteria from a blockage or from abnormalities in the urinary system. These can result from:  Having a tube placed into the bladder (urinary catheter).  Having an enlarged or inflamed prostate gland.  Having recently had urinary tract surgery.  Having a problem with a backward flow of urine (retrograde). In men who have a condition that weakens the body's defense system (immune system), such as HIV, this condition can be caused by:  Other bacteria, including tuberculosis and syphilis.  Viruses.  Fungi. Sometimes this condition occurs without infection. This may happen because of trauma or repetitive activities such as sports. What increases the risk? You are more likely to develop this condition if you have:  Unprotected sex with more than one partner.  Anal sex.  Recently had surgery.  A urinary catheter.  Urinary problems.  A suppressed immune system. What are the signs or symptoms? This condition usually begins suddenly with chills, fever, and pain behind the scrotum and in the testicle. Other symptoms include:  Swelling of the scrotum, testicle, or both.  Pain when ejaculating or urinating.  Pain in the back or  abdomen.  Nausea.  Itching and discharge from the penis.  A frequent need to pass urine.  Redness, increased warmth, and tenderness of the scrotum. How is this diagnosed? Your health care provider can diagnose this condition based on your symptoms and medical history. Your health care provider will also do a physical exam to ask about your symptoms and check your scrotum and testicle for swelling, pain, and redness. You may also have other tests, including:  Examination of discharge from the penis.  Urine tests for infections, such as STDs.  Ultrasound test for blood flow and inflammation. Your health care provider may test you for other STDs, including HIV. How is this treated? Treatment for this condition depends on the cause. If your condition is caused by a bacterial infection, oral antibiotic medicine may be prescribed. If the bacterial infection has spread to your blood, you may need to receive IV antibiotics. For both bacterial and nonbacterial epididymitis, you may be treated with:  Rest.  Elevation of the scrotum.  Pain medicines.  Anti-inflammatory medicines. Surgery may be needed to treat:  Bacterial epididymitis that causes pus to build up in the scrotum (abscess).  Chronic epididymitis that has not responded to other treatments. Follow these instructions at home: Medicines  Take over-the-counter and prescription medicines only as told by your health care provider.  If you were prescribed an antibiotic medicine, take it as told by your health care provider. Do not stop taking the antibiotic even if your condition improves. Sexual activity  If your epididymitis was caused by an STD, avoid sexual activity until your treatment is complete.  Inform your sexual partner or partners if you test positive for   an STD. They may need to be treated. Do not engage in sexual activity with your partner or partners until their treatment is completed. Managing pain and  swelling  If directed, elevate your scrotum and apply ice. ? Put ice in a plastic bag. ? Place a small towel or pillow between your legs. ? Rest your scrotum on the pillow or towel. ? Place another towel between your skin and the plastic bag. ? Leave the ice on for 20 minutes, 2-3 times a day.  Try taking a sitz bath to help with discomfort. This is a warm water bath that is taken while you are sitting down. The water should only come up to your hips and should cover your buttocks. Do this 3-4 times per day or as told by your health care provider.  Keep your scrotum elevated and supported while resting. Ask your health care provider if you should wear a scrotal support, such as a jockstrap. Wear it as told by your health care provider.   General instructions  Return to your normal activities as told by your health care provider. Ask your health care provider what activities are safe for you.  Drink enough fluid to keep your urine pale yellow.  Keep all follow-up visits as told by your health care provider. This is important. Contact a health care provider if:  You have a fever.  Your pain medicine is not helping.  Your pain is getting worse.  Your symptoms do not improve within 3 days. Summary  Epididymitis is swelling (inflammation) or infection of the epididymis. This condition can also cause pain and swelling of the testicle and scrotum.  Treatment for this condition depends on the cause. If your condition is caused by a bacterial infection, oral antibiotic medicine may be prescribed.  Inform your sexual partner or partners if you test positive for an STD. They may need to be treated. Do not engage in sexual activity with your partner or partners until their treatment is completed.  Contact a health care provider if your symptoms do not improve within 3 days. This information is not intended to replace advice given to you by your health care provider. Make sure you discuss any  questions you have with your health care provider. Document Revised: 06/23/2018 Document Reviewed: 06/24/2018 Elsevier Patient Education  2021 Elsevier Inc.  

## 2020-12-26 NOTE — Progress Notes (Signed)
12/26/2020 4:05 PM   Adam Grant 1956-07-26 [id number]  Referring provider: Valera Grant, Adam Grant,  Adam Grant [id number]  Followup testis mass  HPI: [name] is a 71ZH here for followup for a right testis mass. He denies any testis pain. He finished his course of antibiotics. Scrotal US shows no right testis or epididymal mass.    PMH: Past Medical History:  Diagnosis Date  . Diabetes mellitus without complication ([place])   . Hypertension   . Obstructive sleep apnea     Surgical History: No past surgical history on file.  Home Medications:  Allergies as of 12/26/2020      Reactions   Losartan Swelling, Palpitations, Rash   Penicillins Itching, Rash   Tetracyclines & Related Rash      Medication List       Accurate as of December 26, 2020  4:05 PM. If you have any questions, ask your nurse or doctor.        acetaminophen 325 MG tablet Commonly known as: TYLENOL Take by mouth.   amLODipine 5 MG tablet Commonly known as: NORVASC Take by mouth.   aspirin EC 81 MG tablet Take by mouth.   atorvastatin 40 MG tablet Commonly known as: LIPITOR Take by mouth.   benzonatate 100 MG capsule Commonly known as: TESSALON Take 1 capsule (100 mg total) by mouth every 8 (eight) hours.   Complete Tabs Take by mouth.   hydrochlorothiazide 25 MG tablet Commonly known as: HYDRODIURIL Take by mouth.   meloxicam 15 MG tablet Commonly known as: MOBIC Take 1 tablet (15 mg total) by mouth daily.   Rybelsus 3 MG Tabs Generic drug: Semaglutide Take by mouth.   sulfamethoxazole-trimethoprim 800-160 MG tablet Commonly known as: BACTRIM DS Take 1 tablet by mouth 2 (two) times daily.   Tradjenta 5 MG Tabs tablet Generic drug: linagliptin Take 5 mg by mouth daily.   VITAMIN D3 PO Take by mouth.       Allergies:  Allergies  Allergen Reactions  . Losartan Swelling, Palpitations and Rash  . Penicillins Itching and Rash  . Tetracyclines &  Related Rash    Family History: No family history on file.  Social History:  reports that he has never smoked. He has never used smokeless tobacco. He reports current alcohol use. He reports that he does not use drugs.  ROS: All other review of systems were reviewed and are negative except what is noted above in HPI  Physical Exam: BP 135/76   Pulse 70   Temp 97.8 F (36.6 C)   Ht 5\' 11"  (1.803 m)   Wt (!) 322 lb (146.1 kg)   BMI 44.91 kg/m   Constitutional:  Alert and oriented, No acute distress. HEENT:  AT, moist mucus membranes.  Trachea midline, no masses. Cardiovascular: No clubbing, cyanosis, or edema. Respiratory: Normal respiratory effort, no increased work of breathing. GI: Abdomen is soft, nontender, nondistended, no abdominal masses GU: No CVA tenderness.  Lymph: No cervical or inguinal lymphadenopathy. Skin: No rashes, bruises or suspicious lesions. Neurologic: Grossly intact, no focal deficits, moving all 4 extremities. Psychiatric: Normal mood and affect.  Laboratory Data: No results found for: WBC, HGB, HCT, MCV, PLT  No results found for: CREATININE  No results found for: PSA  No results found for: TESTOSTERONE  No results found for: HGBA1C  Urinalysis    Component Value Date/Time   APPEARANCEUR Clear 11/16/2020 1330   GLUCOSEU Trace (A) 11/16/2020 1330  BILIRUBINUR Negative 11/16/2020 1330   PROTEINUR Trace (A) 11/16/2020 1330   NITRITE Negative 11/16/2020 1330   LEUKOCYTESUR Negative 11/16/2020 1330    Lab Results  Component Value Date   LABMICR See below: 11/16/2020   WBCUA None seen 11/16/2020   LABEPIT 0-10 11/16/2020   MUCUS Present 11/16/2020   BACTERIA Few (A) 11/16/2020    Pertinent Imaging: Scrotal US 12/14/2020: Images reviewed and discussed with the patient No results found for this or any previous visit.  No results found for this or any previous visit.  No results found for this or any previous visit.  No results  found for this or any previous visit.  Results for orders placed during the hospital encounter of 02/16/19  US RENAL  Narrative CLINICAL DATA:  Chronic renal disease.  EXAM: RENAL / URINARY TRACT ULTRASOUND COMPLETE  COMPARISON:  No prior.  FINDINGS: Right Kidney:  Renal measurements: 13.5 x 6.1 x 6.0 cm = volume: 259.5 mL . Echogenicity within normal limits. No mass or hydronephrosis visualized.  Left Kidney:  Renal measurements: 13.9 x 5.0 x 5.5 cm = volume: 197.8 mL. Echogenicity within normal limits. No mass or hydronephrosis visualized. 10.4 mm stone.  Bladder:  Appears normal for degree of bladder distention.  IMPRESSION: 1.  10.4 mm stone left kidney.  2.  Exam otherwise unremarkable.   Electronically Signed By: Marcello Moores  Register On: 02/17/2019 08:09  No results found for this or any previous visit.  No results found for this or any previous visit.  No results found for this or any previous visit.   Assessment & Plan:    1. Testicular mass -resolved after antibiotic therapy. RTC prn or 1 year - Urinalysis, Routine w reflex microscopic   No follow-ups on file.  Nicolette Bang, MD  [facility] [place]

## 2020-12-26 NOTE — Progress Notes (Signed)
Urological Symptom Review  Patient is experiencing the following symptoms: Frequent urination Get up at night to urinate   Review of Systems  Gastrointestinal (upper)  : Negative for upper GI symptoms  Gastrointestinal (lower) : Negative for lower GI symptoms  Constitutional : Negative for symptoms  Skin: Negative for skin symptoms  Eyes: Negative for eye symptoms  Ear/Nose/Throat : Sinus problems  Hematologic/Lymphatic: Negative for Hematologic/Lymphatic symptoms  Cardiovascular : Leg swelling  Respiratory : Negative for respiratory symptoms  Endocrine: Negative for endocrine symptoms  Musculoskeletal: Negative for musculoskeletal symptoms  Neurological: Negative for neurological symptoms  Psychologic: Negative for psychiatric symptoms

## 2020-12-29 NOTE — Progress Notes (Signed)
Pt seen in office and notified 4/22.

## 2020-12-29 NOTE — Progress Notes (Signed)
Pt seen in office and notified 4/22. 

## 2021-01-02 ENCOUNTER — Telehealth: Payer: Self-pay

## 2021-01-02 NOTE — Telephone Encounter (Signed)
Pt returned call. Gave [place] results per Dr. Alyson Ingles.

## 2021-01-16 DIAGNOSIS — N1831 Chronic kidney disease, stage 3a: Secondary | ICD-10-CM | POA: Diagnosis not present

## 2021-01-16 DIAGNOSIS — E1169 Type 2 diabetes mellitus with other specified complication: Secondary | ICD-10-CM | POA: Diagnosis not present

## 2021-01-16 DIAGNOSIS — E1142 Type 2 diabetes mellitus with diabetic polyneuropathy: Secondary | ICD-10-CM | POA: Diagnosis not present

## 2021-01-16 DIAGNOSIS — E1122 Type 2 diabetes mellitus with diabetic chronic kidney disease: Secondary | ICD-10-CM | POA: Diagnosis not present

## 2021-01-16 DIAGNOSIS — I872 Venous insufficiency (chronic) (peripheral): Secondary | ICD-10-CM | POA: Diagnosis not present

## 2021-01-30 DIAGNOSIS — N183 Chronic kidney disease, stage 3 unspecified: Secondary | ICD-10-CM | POA: Diagnosis not present

## 2021-01-30 DIAGNOSIS — E1165 Type 2 diabetes mellitus with hyperglycemia: Secondary | ICD-10-CM | POA: Diagnosis not present

## 2021-01-30 DIAGNOSIS — J449 Chronic obstructive pulmonary disease, unspecified: Secondary | ICD-10-CM | POA: Diagnosis not present

## 2021-02-20 DIAGNOSIS — J441 Chronic obstructive pulmonary disease with (acute) exacerbation: Secondary | ICD-10-CM | POA: Diagnosis not present

## 2021-02-20 DIAGNOSIS — N183 Chronic kidney disease, stage 3 unspecified: Secondary | ICD-10-CM | POA: Diagnosis not present

## 2021-02-20 DIAGNOSIS — E1122 Type 2 diabetes mellitus with diabetic chronic kidney disease: Secondary | ICD-10-CM | POA: Diagnosis not present

## 2021-03-02 DIAGNOSIS — M549 Dorsalgia, unspecified: Secondary | ICD-10-CM | POA: Diagnosis not present

## 2021-03-14 DIAGNOSIS — G4733 Obstructive sleep apnea (adult) (pediatric): Secondary | ICD-10-CM | POA: Diagnosis not present

## 2021-04-02 DIAGNOSIS — N183 Chronic kidney disease, stage 3 unspecified: Secondary | ICD-10-CM | POA: Diagnosis not present

## 2021-04-02 DIAGNOSIS — J449 Chronic obstructive pulmonary disease, unspecified: Secondary | ICD-10-CM | POA: Diagnosis not present

## 2021-04-02 DIAGNOSIS — E1165 Type 2 diabetes mellitus with hyperglycemia: Secondary | ICD-10-CM | POA: Diagnosis not present

## 2021-04-14 DIAGNOSIS — G4733 Obstructive sleep apnea (adult) (pediatric): Secondary | ICD-10-CM | POA: Diagnosis not present

## 2021-05-03 DIAGNOSIS — J449 Chronic obstructive pulmonary disease, unspecified: Secondary | ICD-10-CM | POA: Diagnosis not present

## 2021-05-03 DIAGNOSIS — E1165 Type 2 diabetes mellitus with hyperglycemia: Secondary | ICD-10-CM | POA: Diagnosis not present

## 2021-05-03 DIAGNOSIS — N183 Chronic kidney disease, stage 3 unspecified: Secondary | ICD-10-CM | POA: Diagnosis not present

## 2021-05-09 DIAGNOSIS — G959 Disease of spinal cord, unspecified: Secondary | ICD-10-CM | POA: Diagnosis not present

## 2021-05-09 DIAGNOSIS — C720 Malignant neoplasm of spinal cord: Secondary | ICD-10-CM | POA: Diagnosis not present

## 2021-05-09 DIAGNOSIS — Z9889 Other specified postprocedural states: Secondary | ICD-10-CM | POA: Diagnosis not present

## 2021-05-15 DIAGNOSIS — G4733 Obstructive sleep apnea (adult) (pediatric): Secondary | ICD-10-CM | POA: Diagnosis not present

## 2021-05-18 DIAGNOSIS — N183 Chronic kidney disease, stage 3 unspecified: Secondary | ICD-10-CM | POA: Diagnosis not present

## 2021-05-18 DIAGNOSIS — N2581 Secondary hyperparathyroidism of renal origin: Secondary | ICD-10-CM | POA: Diagnosis not present

## 2021-05-18 DIAGNOSIS — E871 Hypo-osmolality and hyponatremia: Secondary | ICD-10-CM | POA: Diagnosis not present

## 2021-05-18 DIAGNOSIS — R319 Hematuria, unspecified: Secondary | ICD-10-CM | POA: Diagnosis not present

## 2021-05-18 DIAGNOSIS — N200 Calculus of kidney: Secondary | ICD-10-CM | POA: Diagnosis not present

## 2021-05-18 DIAGNOSIS — E0822 Diabetes mellitus due to underlying condition with diabetic chronic kidney disease: Secondary | ICD-10-CM | POA: Diagnosis not present

## 2021-05-25 DIAGNOSIS — G4733 Obstructive sleep apnea (adult) (pediatric): Secondary | ICD-10-CM | POA: Diagnosis not present

## 2021-05-25 DIAGNOSIS — E1165 Type 2 diabetes mellitus with hyperglycemia: Secondary | ICD-10-CM | POA: Diagnosis not present

## 2021-05-25 DIAGNOSIS — N183 Chronic kidney disease, stage 3 unspecified: Secondary | ICD-10-CM | POA: Diagnosis not present

## 2021-05-25 DIAGNOSIS — I10 Essential (primary) hypertension: Secondary | ICD-10-CM | POA: Diagnosis not present

## 2021-05-25 DIAGNOSIS — Z23 Encounter for immunization: Secondary | ICD-10-CM | POA: Diagnosis not present

## 2021-05-25 DIAGNOSIS — M259 Joint disorder, unspecified: Secondary | ICD-10-CM | POA: Diagnosis not present

## 2021-05-25 DIAGNOSIS — E1169 Type 2 diabetes mellitus with other specified complication: Secondary | ICD-10-CM | POA: Diagnosis not present

## 2021-05-25 LAB — HEMOGLOBIN A1C: Hemoglobin A1C: 12.6

## 2021-06-14 DIAGNOSIS — G4733 Obstructive sleep apnea (adult) (pediatric): Secondary | ICD-10-CM | POA: Diagnosis not present

## 2021-06-26 DIAGNOSIS — E118 Type 2 diabetes mellitus with unspecified complications: Secondary | ICD-10-CM | POA: Diagnosis not present

## 2021-06-26 DIAGNOSIS — N183 Chronic kidney disease, stage 3 unspecified: Secondary | ICD-10-CM | POA: Diagnosis not present

## 2021-06-26 DIAGNOSIS — Z1211 Encounter for screening for malignant neoplasm of colon: Secondary | ICD-10-CM | POA: Diagnosis not present

## 2021-06-26 DIAGNOSIS — R0981 Nasal congestion: Secondary | ICD-10-CM | POA: Diagnosis not present

## 2021-06-26 DIAGNOSIS — I10 Essential (primary) hypertension: Secondary | ICD-10-CM | POA: Diagnosis not present

## 2021-06-26 DIAGNOSIS — E1169 Type 2 diabetes mellitus with other specified complication: Secondary | ICD-10-CM | POA: Diagnosis not present

## 2021-06-26 DIAGNOSIS — E1165 Type 2 diabetes mellitus with hyperglycemia: Secondary | ICD-10-CM | POA: Diagnosis not present

## 2021-07-03 DIAGNOSIS — N183 Chronic kidney disease, stage 3 unspecified: Secondary | ICD-10-CM | POA: Diagnosis not present

## 2021-07-03 DIAGNOSIS — J449 Chronic obstructive pulmonary disease, unspecified: Secondary | ICD-10-CM | POA: Diagnosis not present

## 2021-07-03 DIAGNOSIS — E1165 Type 2 diabetes mellitus with hyperglycemia: Secondary | ICD-10-CM | POA: Diagnosis not present

## 2021-07-04 DIAGNOSIS — R3121 Asymptomatic microscopic hematuria: Secondary | ICD-10-CM | POA: Diagnosis not present

## 2021-07-04 DIAGNOSIS — N200 Calculus of kidney: Secondary | ICD-10-CM | POA: Diagnosis not present

## 2021-07-06 DIAGNOSIS — Z1211 Encounter for screening for malignant neoplasm of colon: Secondary | ICD-10-CM | POA: Diagnosis not present

## 2021-07-06 DIAGNOSIS — Z1212 Encounter for screening for malignant neoplasm of rectum: Secondary | ICD-10-CM | POA: Diagnosis not present

## 2021-07-10 ENCOUNTER — Ambulatory Visit (INDEPENDENT_AMBULATORY_CARE_PROVIDER_SITE_OTHER): Payer: Medicare Other | Admitting: Internal Medicine

## 2021-07-10 ENCOUNTER — Other Ambulatory Visit: Payer: Self-pay

## 2021-07-10 VITALS — BP 175/79 | HR 76 | Resp 20 | Ht 71.0 in | Wt 321.0 lb

## 2021-07-10 DIAGNOSIS — I10 Essential (primary) hypertension: Secondary | ICD-10-CM

## 2021-07-10 DIAGNOSIS — G4733 Obstructive sleep apnea (adult) (pediatric): Secondary | ICD-10-CM | POA: Diagnosis not present

## 2021-07-10 NOTE — Patient Instructions (Signed)

## 2021-07-10 NOTE — Progress Notes (Signed)
[facility] [place], [place] [address]  Pulmonary Sleep Medicine   Office Visit Note  Patient Name: Adam Grant DOB: 23-Dec-1955 MRN [id number]    Chief Complaint: Obstructive Sleep Apnea visit  Brief History:  Adam Grant is seen today for Initial  consult to establish care and qualify for replacement device. The patient has a 6 year history of sleep apnea. Patient  is not using BiPAP nightly.  history of symptoms includes EDS(excessive daytime sleepiness) and witnessed apneas.   Additional history includes COPD, high cholesterol,  Type 2 diabetes, depression, hyperlipidemia, morbid obesity. Essential hypertension, DVT.  The patient feels OK after sleeping with PAP.  The patient reports benefiting from PAP use. Reported sleepiness is  improved and the Epworth Sleepiness Score is 7 out of 24. The patient does not take naps. The patient complains of the following: oral dryness  The compliance download shows  compliance with an average use time of 2:57 hours @ 23%. The AHI is 12.8  The patient does not complain of limb movements disrupting sleep.  ROS  General: (-) fever, (-) chills, (-) night sweat Nose and Sinuses: (-) nasal stuffiness or itchiness, (-) postnasal drip, (-) nosebleeds, (-) sinus trouble. Mouth and Throat: (-) sore throat, (-) hoarseness. Neck: (-) swollen glands, (-) enlarged thyroid, (-) neck pain. Respiratory: - cough, - shortness of breath, - wheezing. Neurologic: - numbness, - tingling. Psychiatric: - anxiety, - depression   Current Medication: Outpatient Encounter Medications as of 07/10/2021  Medication Sig   acetaminophen (TYLENOL) 325 MG tablet Take by mouth.   amLODipine (NORVASC) 5 MG tablet Take by mouth.   aspirin EC 81 MG tablet Take by mouth.   atorvastatin (LIPITOR) 40 MG tablet Take by mouth.   benzonatate (TESSALON) 100 MG capsule Take 1 capsule (100 mg total) by mouth every 8 (eight) hours.   Cholecalciferol (VITAMIN D3 PO)  Take by mouth.   glipiZIDE (GLUCOTROL XL) 5 MG 24 hr tablet Take 5 mg by mouth daily.   hydrochlorothiazide (HYDRODIURIL) 25 MG tablet Take by mouth.   meloxicam (MOBIC) 15 MG tablet Take 1 tablet (15 mg total) by mouth daily. (Patient not taking: Reported on 12/26/2020)   Multiple Vitamins-Minerals (COMPLETE) TABS Take by mouth.   Semaglutide (RYBELSUS) 3 MG TABS Take by mouth.   sulfamethoxazole-trimethoprim (BACTRIM DS) 800-160 MG tablet Take 1 tablet by mouth 2 (two) times daily.   TRADJENTA 5 MG TABS tablet Take 5 mg by mouth daily.   No facility-administered encounter medications on file as of 07/10/2021.    Surgical History: History reviewed. No pertinent surgical history.  Medical History: Past Medical History:  Diagnosis Date   Diabetes mellitus without complication ([place])    Hypertension    Obstructive sleep apnea     Family History: Non contributory to the present illness  Social History: Social History   Socioeconomic History   Marital status: Divorced    Spouse name: Not on file   Number of children: 3   Years of education: Not on file   Highest education level: Not on file  Occupational History   Occupation: elect. tech  Tobacco Use   Smoking status: Never   Smokeless tobacco: Never  Substance and Sexual Activity   Alcohol use: Yes    Comment: ocass.    Drug use: No   Sexual activity: Not on file  Other Topics Concern   Not on file  Social History Narrative   Not on file   Social  Determinants of Health   Financial Resource Strain: Not on file  Food Insecurity: Not on file  Transportation Needs: Not on file  Physical Activity: Not on file  Stress: Not on file  Social Connections: Not on file  Intimate Partner Violence: Not on file    Vital Signs: Blood pressure (!) 175/79, pulse 76, resp. rate 20, height 5\' 11"  (1.803 m), weight (!) 321 lb (145.6 kg), SpO2 97 %.  Examination: General Appearance: The patient is well-developed, well-nourished,  and in no distress. Neck Circumference: 47 cm Skin: Gross inspection of skin unremarkable. Head: normocephalic, no gross deformities. Eyes: no gross deformities noted. ENT: ears appear grossly normal Neurologic: Alert and oriented. No involuntary movements.    EPWORTH SLEEPINESS SCALE:  Scale:  (0)= no chance of dozing; (1)= slight chance of dozing; (2)= moderate chance of dozing; (3)= high chance of dozing  Chance  Situtation    Sitting and reading: 2    Watching TV: 2    Sitting Inactive in public: 0    As a passenger in car: 1      Lying down to rest: 1    Sitting and talking: 0    Sitting quielty after lunch: 1    In a car, stopped in traffic: 0   TOTAL SCORE:   7 out of 24    SLEEP STUDIES:  HST 09/17/20  - overall REI 37, supine REI 35, lateral REI 24,  SaO2 Nadir 74%   CPAP COMPLIANCE DATA:  Date Range: 06/05/21 - 07/04/21  Average Daily Use: 2:57 hours  Median Use: 3:15  Compliance for > 4 Hours: 23% days  AHI: 12.8 respiratory events per hour  Days Used: 26/30  Mask Leak: 32.1 lpm  95th Percentile Pressure: BiPAP @ 17/13cmH2O         LABS: No results found for this or any previous visit (from the past 2160 hour(s)).  Radiology: US SCROTUM W/DOPPLER  Result Date: 12/15/2020 CLINICAL DATA:  Testicular mass for several months. EXAM: SCROTAL ULTRASOUND DOPPLER ULTRASOUND OF THE TESTICLES TECHNIQUE: Complete ultrasound examination of the testicles, epididymis, and other scrotal structures was performed. Color and spectral Doppler ultrasound were also utilized to evaluate blood flow to the testicles. COMPARISON:  October 20, 2020. FINDINGS: Right testicle Measurements: 3.9 x 4.2 x 2.4 cm. No mass or microlithiasis visualized. Left testicle Measurements: 3.1 x 3.7 x 2.3 cm. No mass or microlithiasis visualized. Right epididymis: Multiple cysts are noted, the largest measuring 7 mm. Left epididymis:  Multiple small cysts are noted. Hydrocele:   Small bilateral hydroceles are noted. Varicocele:  None visualized. Pulsed Doppler interrogation of both testes demonstrates normal low resistance arterial and venous waveforms bilaterally. IMPRESSION: No evidence of testicular mass or torsion. Small bilateral hydroceles are noted. Small bilateral epididymal cysts are noted. Electronically Signed   By: Marijo Conception M.D.   On: 12/15/2020 12:04    No results found.  No results found.    Assessment and Plan: Patient Active Problem List   Diagnosis Date Noted   OSA treated with BiPAP 07/10/2021   Morbid obesity ([place]) 07/10/2021   Testicular mass 11/16/2020   Benign neoplasm of large bowel 11/06/2016   DVT (deep venous thrombosis) ([place]) 11/06/2016   Acquired valgus deformity of left ankle 11/05/2016   Eversion deformity of left foot 11/05/2016   Essential hypertension 05/01/2016   Closed displaced fracture of dome of talus 11/30/2015   Closed fracture of left ankle with malunion 11/30/2015   Combined hyperlipidemia  11/08/2015   Controlled type 2 diabetes mellitus without complication, without long-term current use of insulin ([place]) 11/08/2015   Depression 11/02/2015   Morbid obesity with BMI of 45.0-49.9, adult ([place]) 11/02/2015    1. OSA treated with BiPAP The patient does tolerate PAP and reports  benefit from PAP use. However, his apnea is not controlled. He was only partially controlled at the cpap/bipap titration done at [facility] in June 2022. He will need another bipap titration. The patient was reminded how to clean equipment and advised to replace supplies more frequently. The patient was also counselled on weight loss. The compliance is poor. The AHI is 12.8.   OSA treated with bipap-  not controlled with central apnea. Repeat titration.   2. Morbid obesity ([place]) Obesity Counseling: Had a lengthy discussion regarding patients BMI and weight issues. Patient was instructed on portion control as well as increased activity. Also  discussed caloric restrictions with trying to maintain intake less than 2000 Kcal. Discussions were made in accordance with the 5As of weight management. Simple actions such as not eating late and if able to, taking a walk is suggested.   3. Essential hypertension Hypertension Counseling:   The following hypertensive lifestyle modification were recommended and discussed:  1. Limiting alcohol intake to less than 1 oz/day of ethanol:(24 oz of beer or 8 oz of wine or 2 oz of 100-proof whiskey). 2. Take baby ASA 81 mg daily. 3. Importance of regular aerobic exercise and losing weight. 4. Reduce dietary saturated fat and cholesterol intake for overall cardiovascular health. 5. Maintaining adequate dietary potassium, calcium, and magnesium intake. 6. Regular monitoring of the blood pressure. 7. Reduce sodium intake to less than 100 mmol/day (less than 2.3 gm of sodium or less than 6 gm of sodium choride)     General Counseling: I have discussed the findings of the evaluation and examination with Adam Grant.  I have also discussed any further diagnostic evaluation thatmay be needed or ordered today. Adam Grant verbalizes understanding of the findings of todays visit. We also reviewed his medications today and discussed drug interactions and side effects including but not limited excessive drowsiness and altered mental states. We also discussed that there is always a risk not just to him but also people around him. he has been encouraged to call the office with any questions or concerns that should arise related to todays visit.  No orders of the defined types were placed in this encounter.       I have personally obtained a history, examined the patient, evaluated laboratory and imaging results, formulated the assessment and plan and placed orders. This patient was seen today by Tressie Clinger, PA-C in collaboration with Dr. Devona Konig.   Allyne Gee, MD [facility] Diplomate ABMS Pulmonary Critical Care Medicine  and Sleep Medicine

## 2021-08-02 DIAGNOSIS — E1165 Type 2 diabetes mellitus with hyperglycemia: Secondary | ICD-10-CM | POA: Diagnosis not present

## 2021-08-02 DIAGNOSIS — M009 Pyogenic arthritis, unspecified: Secondary | ICD-10-CM | POA: Diagnosis not present

## 2021-08-02 DIAGNOSIS — J449 Chronic obstructive pulmonary disease, unspecified: Secondary | ICD-10-CM | POA: Diagnosis not present

## 2021-08-02 DIAGNOSIS — S51031A Puncture wound without foreign body of right elbow, initial encounter: Secondary | ICD-10-CM | POA: Diagnosis not present

## 2021-08-02 DIAGNOSIS — N183 Chronic kidney disease, stage 3 unspecified: Secondary | ICD-10-CM | POA: Diagnosis not present

## 2021-08-10 DIAGNOSIS — E1122 Type 2 diabetes mellitus with diabetic chronic kidney disease: Secondary | ICD-10-CM | POA: Diagnosis not present

## 2021-08-10 DIAGNOSIS — M25421 Effusion, right elbow: Secondary | ICD-10-CM | POA: Diagnosis not present

## 2021-08-10 DIAGNOSIS — I129 Hypertensive chronic kidney disease with stage 1 through stage 4 chronic kidney disease, or unspecified chronic kidney disease: Secondary | ICD-10-CM | POA: Diagnosis not present

## 2021-08-10 DIAGNOSIS — E7800 Pure hypercholesterolemia, unspecified: Secondary | ICD-10-CM | POA: Diagnosis not present

## 2021-08-10 DIAGNOSIS — M25521 Pain in right elbow: Secondary | ICD-10-CM | POA: Diagnosis not present

## 2021-08-10 DIAGNOSIS — N189 Chronic kidney disease, unspecified: Secondary | ICD-10-CM | POA: Diagnosis not present

## 2021-08-10 DIAGNOSIS — M7989 Other specified soft tissue disorders: Secondary | ICD-10-CM | POA: Diagnosis not present

## 2021-08-10 DIAGNOSIS — M19021 Primary osteoarthritis, right elbow: Secondary | ICD-10-CM | POA: Diagnosis not present

## 2021-08-10 DIAGNOSIS — M1990 Unspecified osteoarthritis, unspecified site: Secondary | ICD-10-CM | POA: Diagnosis not present

## 2021-08-14 DIAGNOSIS — G4733 Obstructive sleep apnea (adult) (pediatric): Secondary | ICD-10-CM | POA: Diagnosis not present

## 2021-08-19 DIAGNOSIS — M25521 Pain in right elbow: Secondary | ICD-10-CM | POA: Diagnosis not present

## 2021-08-21 DIAGNOSIS — M79672 Pain in left foot: Secondary | ICD-10-CM | POA: Diagnosis not present

## 2021-09-11 DIAGNOSIS — I10 Essential (primary) hypertension: Secondary | ICD-10-CM | POA: Diagnosis not present

## 2021-09-11 DIAGNOSIS — I878 Other specified disorders of veins: Secondary | ICD-10-CM | POA: Diagnosis not present

## 2021-09-11 DIAGNOSIS — E1165 Type 2 diabetes mellitus with hyperglycemia: Secondary | ICD-10-CM | POA: Diagnosis not present

## 2021-09-14 DIAGNOSIS — G4733 Obstructive sleep apnea (adult) (pediatric): Secondary | ICD-10-CM | POA: Diagnosis not present

## 2021-09-22 DIAGNOSIS — Z880 Allergy status to penicillin: Secondary | ICD-10-CM | POA: Diagnosis not present

## 2021-09-22 DIAGNOSIS — N200 Calculus of kidney: Secondary | ICD-10-CM | POA: Diagnosis not present

## 2021-09-22 DIAGNOSIS — R3121 Asymptomatic microscopic hematuria: Secondary | ICD-10-CM | POA: Diagnosis not present

## 2021-09-22 DIAGNOSIS — Z7982 Long term (current) use of aspirin: Secondary | ICD-10-CM | POA: Diagnosis not present

## 2021-09-22 DIAGNOSIS — E7800 Pure hypercholesterolemia, unspecified: Secondary | ICD-10-CM | POA: Diagnosis not present

## 2021-09-22 DIAGNOSIS — I10 Essential (primary) hypertension: Secondary | ICD-10-CM | POA: Diagnosis not present

## 2021-09-22 DIAGNOSIS — N132 Hydronephrosis with renal and ureteral calculous obstruction: Secondary | ICD-10-CM | POA: Diagnosis not present

## 2021-09-22 DIAGNOSIS — Z7984 Long term (current) use of oral hypoglycemic drugs: Secondary | ICD-10-CM | POA: Diagnosis not present

## 2021-09-22 DIAGNOSIS — E119 Type 2 diabetes mellitus without complications: Secondary | ICD-10-CM | POA: Diagnosis not present

## 2021-09-22 DIAGNOSIS — N2889 Other specified disorders of kidney and ureter: Secondary | ICD-10-CM | POA: Diagnosis not present

## 2021-09-22 DIAGNOSIS — R319 Hematuria, unspecified: Secondary | ICD-10-CM | POA: Diagnosis not present

## 2021-10-01 DIAGNOSIS — J449 Chronic obstructive pulmonary disease, unspecified: Secondary | ICD-10-CM | POA: Diagnosis not present

## 2021-10-01 DIAGNOSIS — E1165 Type 2 diabetes mellitus with hyperglycemia: Secondary | ICD-10-CM | POA: Diagnosis not present

## 2021-10-01 DIAGNOSIS — N183 Chronic kidney disease, stage 3 unspecified: Secondary | ICD-10-CM | POA: Diagnosis not present

## 2021-10-03 ENCOUNTER — Encounter: Payer: Self-pay | Admitting: Nurse Practitioner

## 2021-10-03 ENCOUNTER — Other Ambulatory Visit: Payer: Self-pay

## 2021-10-03 ENCOUNTER — Ambulatory Visit: Payer: Medicare Other | Admitting: Nurse Practitioner

## 2021-10-03 VITALS — BP 149/71 | HR 67 | Ht 71.0 in | Wt 352.8 lb

## 2021-10-03 DIAGNOSIS — E782 Mixed hyperlipidemia: Secondary | ICD-10-CM | POA: Diagnosis not present

## 2021-10-03 DIAGNOSIS — N1832 Chronic kidney disease, stage 3b: Secondary | ICD-10-CM | POA: Diagnosis not present

## 2021-10-03 DIAGNOSIS — I10 Essential (primary) hypertension: Secondary | ICD-10-CM | POA: Diagnosis not present

## 2021-10-03 DIAGNOSIS — E1122 Type 2 diabetes mellitus with diabetic chronic kidney disease: Secondary | ICD-10-CM

## 2021-10-03 MED ORDER — ACCU-CHEK GUIDE VI STRP: ORAL_STRIP | 3 refills | Status: AC

## 2021-10-03 MED ORDER — ACCU-CHEK SOFTCLIX LANCETS MISC: 12 refills | Status: AC

## 2021-10-03 NOTE — Progress Notes (Signed)
Endocrinology Consult Note       10/03/2021, 4:03 PM   Subjective:    Patient ID: Adam Grant, male    DOB: [birth year].  Adam Grant is being seen in consultation for management of currently uncontrolled symptomatic diabetes requested by  Bonnita Hollow, MD.   Past Medical History:  Diagnosis Date   Diabetes mellitus without complication ([place])    Hypertension    Obstructive sleep apnea     Past Surgical History:  Procedure Laterality Date   EYE SURGERY Bilateral    1978, 2001   SPINE SURGERY     Spine Cancer 2019    Social History   Socioeconomic History   Marital status: Divorced    Spouse name: Not on file   Number of children: 3   Years of education: Not on file   Highest education level: Not on file  Occupational History   Occupation: elect. tech  Tobacco Use   Smoking status: Never    Passive exposure: Past   Smokeless tobacco: Never  Vaping Use   Vaping Use: Never used  Substance and Sexual Activity   Alcohol use: Yes    Comment: ocass.    Drug use: No   Sexual activity: Not on file  Other Topics Concern   Not on file  Social History Narrative   Not on file   Social Determinants of Health   Financial Resource Strain: Not on file  Food Insecurity: Not on file  Transportation Needs: Not on file  Physical Activity: Not on file  Stress: Not on file  Social Connections: Not on file    Family History  Problem Relation Age of Onset   Cancer Mother    Hypertension Mother    Hyperlipidemia Mother    Hypertension Father    Cancer Father    Heart attack Father    Heart failure Father     Outpatient Encounter Medications as of 10/03/2021  Medication Sig   Accu-Chek Softclix Lancets lancets Use as instructed to monitor glucose 4 times daily   acetaminophen (TYLENOL) 325 MG tablet Take by mouth.   amLODipine (NORVASC) 10 MG tablet    aspirin EC 81 MG tablet Take by mouth.    atorvastatin (LIPITOR) 40 MG tablet Take by mouth.   Cholecalciferol (VITAMIN D3 PO) Take by mouth.   glipiZIDE (GLUCOTROL XL) 10 MG 24 hr tablet Take 10 mg by mouth daily.   hydrochlorothiazide (HYDRODIURIL) 25 MG tablet Take by mouth.   Multiple Vitamins-Minerals (COMPLETE) TABS Take by mouth.   tamsulosin (FLOMAX) 0.4 MG CAPS capsule Take by mouth.   [DISCONTINUED] ACCU-CHEK GUIDE test strip    ACCU-CHEK GUIDE test strip Use to monitor glucose 4 times daily, before meals and before bed.   [DISCONTINUED] amLODipine (NORVASC) 5 MG tablet Take by mouth.   [DISCONTINUED] benzonatate (TESSALON) 100 MG capsule Take 1 capsule (100 mg total) by mouth every 8 (eight) hours. (Patient not taking: Reported on 10/03/2021)   [DISCONTINUED] glipiZIDE (GLUCOTROL XL) 5 MG 24 hr tablet Take 10 mg by mouth daily. (Patient not taking: Reported on 10/03/2021)   [DISCONTINUED] meloxicam (MOBIC) 15 MG tablet Take 1 tablet (15 mg total)  by mouth daily. (Patient not taking: Reported on 12/26/2020)   [DISCONTINUED] Semaglutide (RYBELSUS) 3 MG TABS Take by mouth. (Patient not taking: Reported on 10/03/2021)   [DISCONTINUED] sulfamethoxazole-trimethoprim (BACTRIM DS) 800-160 MG tablet Take 1 tablet by mouth 2 (two) times daily. (Patient not taking: Reported on 10/03/2021)   [DISCONTINUED] TRADJENTA 5 MG TABS tablet Take 5 mg by mouth daily. (Patient not taking: Reported on 10/03/2021)   No facility-administered encounter medications on file as of 10/03/2021.    ALLERGIES: Allergies  Allergen Reactions   Farxiga [Dapagliflozin] Rash   Losartan Swelling, Palpitations and Rash   Penicillins Itching and Rash   Tetracyclines & Related Rash    VACCINATION STATUS: Immunization History  Administered Date(s) Administered   PFIZER Comirnaty(Gray Top)Covid-19 Tri-Sucrose Vaccine 10/24/2019, 11/15/2019, 07/20/2020   PFIZER(Purple Top)SARS-COV-2 Vaccination 10/24/2019    Diabetes He presents for his initial diabetic  visit. He has type 2 diabetes mellitus. Onset time: Diagnosed at approx age of 66. His disease course has been fluctuating. There are no hypoglycemic associated symptoms. Associated symptoms include blurred vision, fatigue, foot ulcerations, polydipsia and polyuria. There are no hypoglycemic complications. Symptoms are stable. Diabetic complications include nephropathy and retinopathy. Risk factors for coronary artery disease include diabetes mellitus, dyslipidemia, family history, male sex, obesity, hypertension and sedentary lifestyle. Current diabetic treatment includes oral agent (monotherapy). He is compliant with treatment most of the time. His weight is increasing steadily. He is following a generally unhealthy diet. When asked about meal planning, he reported none. He has had a previous visit with a dietitian (has seen dietician in distant past). His home blood glucose trend is decreasing steadily. His breakfast blood glucose range is generally 140-180 mg/dl. His overall blood glucose range is 140-180 mg/dl. (He presents today for his consultation with his logs, no meter, showing slowly improving fasting glycemic profile ranging in the 140s.  His most recent A1c on 05/25/21 was 12.6%, reports he had it checked since then and it was 8.5% but there are no records of this.  He only monitors glucose once daily.  He drinks mostly water and an occasional beer every now and then.  He skips lunch on most days and rarely eats snacks.  He does not engage in routine physical activity as he is limited physically by walking with a cane and occasional falls.  He is due for his next eye exam coming up in March.  He had been on Farxiga in the past but developed allergy to it with a severe rash.  He also reports he did not tolerate Tradjenta or Rybelsus in the past.  ) An ACE inhibitor/angiotensin II receptor blocker is not being taken. He does not see a podiatrist.Eye exam is current.  Hyperlipidemia This is a chronic  problem. The current episode started more than 1 year ago. Exacerbating diseases include chronic renal disease, diabetes and obesity. Factors aggravating his hyperlipidemia include thiazides and fatty foods. Current antihyperlipidemic treatment includes statins. Compliance problems include adherence to diet and adherence to exercise.  Risk factors for coronary artery disease include diabetes mellitus, dyslipidemia, family history, male sex, obesity, hypertension and a sedentary lifestyle.  Hypertension This is a chronic problem. The current episode started more than 1 year ago. The problem is unchanged. The problem is uncontrolled. Associated symptoms include blurred vision. There are no associated agents to hypertension. Risk factors for coronary artery disease include dyslipidemia, diabetes mellitus, family history, obesity, male gender and sedentary lifestyle. Past treatments include diuretics and calcium channel blockers. The current  treatment provides mild improvement. Compliance problems include diet and exercise.  Hypertensive end-organ damage includes kidney disease and retinopathy. Identifiable causes of hypertension include chronic renal disease and sleep apnea.    Review of systems  Constitutional: + steadily increasing body weight, current Body mass index is 49.21 kg/m., + fatigue, no subjective hyperthermia, no subjective hypothermia Eyes: no blurry vision, no xerophthalmia ENT: no sore throat, no nodules palpated in throat, no dysphagia/odynophagia, no hoarseness Cardiovascular: no chest pain, no shortness of breath, no palpitations, no leg swelling Respiratory: no cough, no shortness of breath Gastrointestinal: no nausea/vomiting/diarrhea Genitourinary: + polyuria Musculoskeletal: no muscle/joint aches, walks with cane Skin: no rashes, no hyperemia, + foot ulcer- seeing vascular specialist soon Neurological: no tremors, no numbness, no tingling, no dizziness Psychiatric: no  depression, no anxiety  Objective:     BP (!) 149/71    Pulse 67    Ht 5\' 11"  (1.803 m) Comment: Measured with shoes on   Wt (!) 352 lb 12.8 oz (160 kg) Comment: Measured with shoes on   SpO2 98%    BMI 49.21 kg/m   Wt Readings from Last 3 Encounters:  10/03/21 (!) 352 lb 12.8 oz (160 kg)  07/10/21 (!) 321 lb (145.6 kg)  12/26/20 (!) 322 lb (146.1 kg)     BP Readings from Last 3 Encounters:  10/03/21 (!) 149/71  07/10/21 (!) 175/79  12/26/20 135/76     Physical Exam- Limited  Constitutional:  Body mass index is 49.21 kg/m. , not in acute distress, distracted state of mind with frequent interruptions Eyes:  EOMI, no exophthalmos Neck: Supple Musculoskeletal: no gross deformities, strength intact in all four extremities, no gross restriction of joint movements, walks with cane Skin:  no rashes, no hyperemia Neurological: no tremor with outstretched hands    CMP ( most recent) CMP     Component Value Date/Time   BUN 23 (A) 11/09/2020 0000   CREATININE 1.8 (A) 11/09/2020 0000   ALKPHOS 131 (A) 11/09/2020 0000   GFRNONAA 40 11/09/2020 0000     Diabetic Labs (most recent): Lab Results  Component Value Date   HGBA1C 12.6 05/25/2021   HGBA1C 6.7 11/09/2020     Lipid Panel ( most recent) Lipid Panel  No results found for: CHOL, TRIG, HDL, CHOLHDL, VLDL, LDLCALC, LDLDIRECT, LABVLDL    No results found for: TSH, FREET4         Assessment & Plan:   1) Type 2 diabetes mellitus with stage 3b chronic kidney disease, without long-term current use of insulin ([place])  He presents today for his consultation with his logs, no meter, showing slowly improving fasting glycemic profile ranging in the 140s.  His most recent A1c on 05/25/21 was 12.6%, reports he had it checked since then and it was 8.5% but there are no records of this.  He only monitors glucose once daily.  He drinks mostly water and an occasional beer every now and then.  He skips lunch on most days and rarely  eats snacks.  He does not engage in routine physical activity as he is limited physically by walking with a cane and occasional falls.  He is due for his next eye exam coming up in March.  He had been on Farxiga in the past but developed allergy to it with a severe rash.  He also reports he did not tolerate Tradjenta or Rybelsus in the past.    - PARAS KREIDER has currently uncontrolled symptomatic type 2 DM since  66 years of age, with most recent A1c of 12.6 %.   -Recent labs reviewed.  - I had a long discussion with him about the progressive nature of diabetes and the pathology behind its complications. -his diabetes is complicated by nonhealing diabetic foot ulcer, PAD, CKD, retinopathy and he remains at a high risk for more acute and chronic complications which include CAD, CVA, CKD, retinopathy, and neuropathy. These are all discussed in detail with him.  - I have counseled him on diet and weight management by adopting a carbohydrate restricted/protein rich diet. Patient is encouraged to switch to unprocessed or minimally processed complex starch and increased protein intake (animal or plant source), fruits, and vegetables. -  he is advised to stick to a routine mealtimes to eat 3 meals a day and avoid unnecessary snacks (to snack only to correct hypoglycemia).   - he acknowledges that there is a room for improvement in his food and drink choices. - Suggestion is made for him to avoid simple carbohydrates from his diet including Cakes, Sweet Desserts, Ice Cream, Soda (diet and regular), Sweet Tea, Candies, Chips, Cookies, Store Bought Juices, Alcohol in Excess of 1-2 drinks a day, Artificial Sweeteners, Coffee Creamer, and "Sugar-free" Products. This will help patient to have more stable blood glucose profile and potentially avoid unintended weight gain.  - I have approached him with the following individualized plan to manage his diabetes and patient agrees:   -he is encouraged to start  monitoring glucose 4 times daily, before meals and before bed, to log their readings on the clinic sheets provided, and bring them to review at follow up appointment in 2 weeks.  - Adjustment parameters are given to him for hypo and hyperglycemia in writing. - he is encouraged to call clinic for blood glucose levels less than 70 or above 300 mg /dl. - he is advised to continue Glipizide 10 mg ER daily with breakfast (recently increased about 1 month ago by his PCP), therapeutically suitable for patient .  - he is not a candidate for Metformin due to concurrent renal insufficiency.  - he will be considered for incretin therapy as appropriate next visit.  - Specific targets for  A1c; LDL, HDL, and Triglycerides were discussed with the patient.  2) Blood Pressure /Hypertension:  his blood pressure is controlled to target.   he is advised to continue his current medications including Norvasc 10 mg p.o. daily with breakfast, and HCTZ 25 mg po daily.  3) Lipids/Hyperlipidemia:    There is no recent lipid panel to review.  he is advised to continue Lipitor 40 mg daily at bedtime.  Side effects and precautions discussed with him.  4)  Weight/Diet:  his Body mass index is 49.21 kg/m.  -  clearly complicating his diabetes care.   he is a candidate for weight loss. I discussed with him the fact that loss of 5 - 10% of his  current body weight will have the most impact on his diabetes management.  Exercise, and detailed carbohydrates information provided  -  detailed on discharge instructions.  5) Chronic Care/Health Maintenance: -he is not on ACEI/ARB and is on Statin medications and is encouraged to initiate and continue to follow up with Ophthalmology, Dentist, Podiatrist at least yearly or according to recommendations, and advised to stay away from smoking. I have recommended yearly flu vaccine and pneumonia vaccine at least every 5 years; moderate intensity exercise for up to 150 minutes weekly; and  sleep for at least  7 hours a day.  - he is advised to maintain close follow up with Bonnita Hollow, MD for primary care needs, as well as his other providers for optimal and coordinated care.   - Time spent in this patient care: 60 min, of which > 50% was spent in counseling him about his diabetes and the rest reviewing his blood glucose logs, discussing his hypoglycemia and hyperglycemia episodes, reviewing his current and previous labs/studies (including abstraction from other facilities) and medications doses and developing a long term treatment plan based on the latest standards of care/guidelines; and documenting his care.    Please refer to Patient Instructions for Blood Glucose Monitoring and Insulin/Medications Dosing Guide" in media tab for additional information. Please also refer to "Patient Self Inventory" in the Media tab for reviewed elements of pertinent patient history.  Graylin Shiver participated in the discussions, expressed understanding, and voiced agreement with the above plans.  All questions were answered to his satisfaction. he is encouraged to contact clinic should he have any questions or concerns prior to his return visit.     Follow up plan: - Return in about 2 weeks (around 10/17/2021) for Diabetes F/U, Bring meter and logs.    Rayetta Pigg, [facility] [address] [place], [place] [address] Phone: [phone] Fax: [phone]  10/03/2021, 4:03 PM

## 2021-10-03 NOTE — Patient Instructions (Signed)

## 2021-10-11 ENCOUNTER — Other Ambulatory Visit: Payer: Self-pay

## 2021-10-11 DIAGNOSIS — I872 Venous insufficiency (chronic) (peripheral): Secondary | ICD-10-CM

## 2021-10-12 DIAGNOSIS — W312XXA Contact with powered woodworking and forming machines, initial encounter: Secondary | ICD-10-CM | POA: Diagnosis not present

## 2021-10-12 DIAGNOSIS — Z880 Allergy status to penicillin: Secondary | ICD-10-CM | POA: Diagnosis not present

## 2021-10-12 DIAGNOSIS — S68610A Complete traumatic transphalangeal amputation of right index finger, initial encounter: Secondary | ICD-10-CM | POA: Diagnosis not present

## 2021-10-12 DIAGNOSIS — S68129A Partial traumatic metacarpophalangeal amputation of unspecified finger, initial encounter: Secondary | ICD-10-CM | POA: Diagnosis not present

## 2021-10-12 DIAGNOSIS — S68120A Partial traumatic metacarpophalangeal amputation of right index finger, initial encounter: Secondary | ICD-10-CM | POA: Diagnosis not present

## 2021-10-13 DIAGNOSIS — S68629A Partial traumatic transphalangeal amputation of unspecified finger, initial encounter: Secondary | ICD-10-CM | POA: Diagnosis not present

## 2021-10-15 DIAGNOSIS — G4733 Obstructive sleep apnea (adult) (pediatric): Secondary | ICD-10-CM | POA: Diagnosis not present

## 2021-10-17 ENCOUNTER — Ambulatory Visit (HOSPITAL_COMMUNITY)
Admission: RE | Admit: 2021-10-17 | Discharge: 2021-10-17 | Disposition: A | Discharge status: Home / Self Care | Payer: Medicare Other | Source: Ambulatory Visit | Attending: Vascular Surgery | Admitting: Vascular Surgery

## 2021-10-17 ENCOUNTER — Ambulatory Visit: Payer: Medicare Other | Admitting: Physician Assistant

## 2021-10-17 ENCOUNTER — Other Ambulatory Visit: Payer: Self-pay

## 2021-10-17 ENCOUNTER — Encounter: Payer: Self-pay | Admitting: Physician Assistant

## 2021-10-17 VITALS — BP 121/69 | HR 71 | Temp 98.0°F | Resp 18 | Ht 71.0 in | Wt 345.0 lb

## 2021-10-17 DIAGNOSIS — S68629A Partial traumatic transphalangeal amputation of unspecified finger, initial encounter: Secondary | ICD-10-CM | POA: Diagnosis not present

## 2021-10-17 DIAGNOSIS — I872 Venous insufficiency (chronic) (peripheral): Secondary | ICD-10-CM

## 2021-10-17 NOTE — Progress Notes (Signed)
Requested by:  Valera Castle, [address] [place],  [place] [address]  Reason for consultation: venous stasis   History of Present Illness   Adam Grant is a 66 y.o. ([birth year]) male who presents for evaluation of bilateral lower extremity swelling and venous stasis, left > right. He explains that he has had lower extremity swelling and skin changes with history of ulceration over past 20 years. In the 90's he says he had lazer ablations of both GSV's. This was done to help heal his ulcers. However he had no improvement afterwards. Took a long time for his wounds to eventually heal.  He has been wearing knee high compression stockings every since. He does not wear them every day but most days of the week. He explains that he just bought a new pair and these are helping him more with the swelling as his old pair was very worn out. He does elevate his legs when in bed as he has an adjustable bed. However he explains that sometimes his legs go numb when he elevates them. He has history of a mass on his spine that was removed but has caused a lot of pain and back issues. He also has had numerous injuries that have left him with limited mobility/ ambulation. He explains he cannot stand or ambulate far distances any more.  He has family history of venous disease in his father. He does have history of DVT back in 2000 when he fell off a roof and fractured his pelvis. No other DVT and no family history of DVT.   Venous symptoms include: heavy, swelling, skin changes Onset/duration:  > 20 years  Occupation:  retired Art gallery manager Aggravating factors: sitting, standing Alleviating factors: elevation, compression Compression:  yes, knee high Helps:  yes Pain medications:  none Previous vein procedures:  yes bilateral venous ablations History of DVT:  yes, around 2000  Past Medical History:  Diagnosis Date   Diabetes mellitus without complication ([place])    Hypertension     Obstructive sleep apnea     Past Surgical History:  Procedure Laterality Date   EYE SURGERY Bilateral    1978, 2001   SPINE SURGERY     Spine Cancer 2019    Social History   Socioeconomic History   Marital status: Divorced    Spouse name: Not on file   Number of children: 3   Years of education: Not on file   Highest education level: Not on file  Occupational History   Occupation: elect. tech  Tobacco Use   Smoking status: Never    Passive exposure: Past   Smokeless tobacco: Never  Vaping Use   Vaping Use: Never used  Substance and Sexual Activity   Alcohol use: Yes    Comment: ocass.    Drug use: No   Sexual activity: Not on file  Other Topics Concern   Not on file  Social History Narrative   Not on file   Social Determinants of Health   Financial Resource Strain: Not on file  Food Insecurity: Not on file  Transportation Needs: Not on file  Physical Activity: Not on file  Stress: Not on file  Social Connections: Not on file  Intimate Partner Violence: Not on file    Family History  Problem Relation Age of Onset   Cancer Mother    Hypertension Mother    Hyperlipidemia Mother    Hypertension Father    Cancer Father  Heart attack Father    Heart failure Father     Current Outpatient Medications  Medication Sig Dispense Refill   ACCU-CHEK GUIDE test strip Use to monitor glucose 4 times daily, before meals and before bed. 200 strip 3   Accu-Chek Softclix Lancets lancets Use as instructed to monitor glucose 4 times daily 100 each 12   acetaminophen (TYLENOL) 325 MG tablet Take by mouth.     amLODipine (NORVASC) 10 MG tablet      aspirin EC 81 MG tablet Take by mouth.     atorvastatin (LIPITOR) 40 MG tablet Take by mouth.     Cholecalciferol (VITAMIN D3 PO) Take by mouth.     glipiZIDE (GLUCOTROL XL) 10 MG 24 hr tablet Take 10 mg by mouth daily.     Multiple Vitamins-Minerals (COMPLETE) TABS Take by mouth.     tamsulosin (FLOMAX) 0.4 MG CAPS capsule  Take by mouth.     hydrochlorothiazide (HYDRODIURIL) 25 MG tablet Take by mouth.     No current facility-administered medications for this visit.    Allergies  Allergen Reactions   Farxiga [Dapagliflozin] Rash   Losartan Swelling, Palpitations and Rash   Penicillins Itching and Rash   Tetracyclines & Related Rash    REVIEW OF SYSTEMS (negative unless checked):   Cardiac:  []  Chest pain or chest pressure? []  Shortness of breath upon activity? []  Shortness of breath when lying flat? []  Irregular heart rhythm?  Vascular:  []  Pain in calf, thigh, or hip brought on by walking? []  Pain in feet at night that wakes you up from your sleep? []  Blood clot in your veins? X Leg swelling?  Pulmonary:  []  Oxygen at home? []  Productive cough? []  Wheezing?  Neurologic:  []  Sudden weakness in arms or legs? []  Sudden numbness in arms or legs? []  Sudden onset of difficult speaking or slurred speech? []  Temporary loss of vision in one eye? []  Problems with dizziness?  Gastrointestinal:  []  Blood in stool? []  Vomited blood?  Genitourinary:  []  Burning when urinating? []  Blood in urine?  Psychiatric:  []  Major depression  Hematologic:  []  Bleeding problems? []  Problems with blood clotting?  Dermatologic:  []  Rashes or ulcers?  Constitutional:  []  Fever or chills?  Ear/Nose/Throat:  []  Change in hearing? []  Nose bleeds? []  Sore throat?  Musculoskeletal:  []  Back pain? []  Joint pain? []  Muscle pain?   Physical Examination     Vitals:   10/17/21 1114  BP: 121/69  Pulse: 71  Resp: 18  Temp: 98 F (36.7 C)  TempSrc: Temporal  SpO2: 97%  Weight: (!) 345 lb (156.5 kg)  Height: 5\' 11"  (1.803 m)   Body mass index is 48.12 kg/m.  General:  WDWN in NAD; vital signs documented above Gait: Normal HENT: WNL, normocephalic Pulmonary: normal non-labored breathing , without wheezing Cardiac: regular HR, without  Murmurs  Abdomen: obese, soft Vascular  Exam/Pulses: 2+ radial pulses, 1+ DP pulses bilaterally Extremities: without varicose veins, without reticular veins, with edema bilaterally. without stasis pigmentation, with lipodermatosclerosis of Left leg, without ulcers Musculoskeletal: no muscle wasting or atrophy  Neurologic: A&O X 3;  No focal weakness or paresthesias are detected Psychiatric:  The pt has Normal affect.  Non-invasive Vascular Imaging   BLE Venous Insufficiency Duplex (10/17/21):   LLE: No DVT and SVT GSV reflux SFJ to mid thigh GSV diameter .[phone] No SSV reflux  CFV deep venous reflux There is note of prior ablation/ stripping of GSV with absence  of GSV in distal thigh and varicosed branch from mid to distal thigh   Medical Decision Making   Adam Grant is a 66 y.o. male who presents with: LLE chronic venous insufficiency with swelling. His duplex today shows no DVT or SVT. He does have GSV reflux from SFJ to mid thigh. GSV not visualized in distal thigh secondary to hx of ablation. No SSV reflux. He does have deep reflux in CFV. His GSV is of adequate size to be considered for ablation. Based on the patient's history and examination, I recommend: continued daily elevation, continued daily compression, weight loss, exercise therapy especially aquatics, and refraining from prolonged sitting or standing Due to patients body habitus he is unable to fit in thigh high compression. He has been wearing knee high 20-30 mmHg for 20 years. The patient will follow up in 3 months with MD for further evaluation to see if he will be good candidate for venous ablation Thank you for allowing [place] to participate in this patient's care.   Karoline Caldwell, PA-C [facility] Office: [phone]  10/17/2021, 1:32 PM  Clinic MD: Roxanne Mins

## 2021-10-18 ENCOUNTER — Ambulatory Visit (INDEPENDENT_AMBULATORY_CARE_PROVIDER_SITE_OTHER): Payer: Medicare Other | Admitting: Nurse Practitioner

## 2021-10-18 ENCOUNTER — Encounter: Payer: Self-pay | Admitting: Nurse Practitioner

## 2021-10-18 VITALS — BP 143/73 | HR 68 | Ht 71.0 in | Wt 352.0 lb

## 2021-10-18 DIAGNOSIS — E782 Mixed hyperlipidemia: Secondary | ICD-10-CM

## 2021-10-18 DIAGNOSIS — N1832 Chronic kidney disease, stage 3b: Secondary | ICD-10-CM

## 2021-10-18 DIAGNOSIS — E1122 Type 2 diabetes mellitus with diabetic chronic kidney disease: Secondary | ICD-10-CM | POA: Diagnosis not present

## 2021-10-18 DIAGNOSIS — I10 Essential (primary) hypertension: Secondary | ICD-10-CM

## 2021-10-18 LAB — POCT GLYCOSYLATED HEMOGLOBIN (HGB A1C): HbA1c, POC (controlled diabetic range): 7.6 % — AB (ref 0.0–7.0)

## 2021-10-18 NOTE — Patient Instructions (Signed)

## 2021-10-18 NOTE — Progress Notes (Signed)
Endocrinology Follow Up Note       10/18/2021, 3:51 PM   Subjective:    Patient ID: Adam Grant, male    DOB: 1956-02-01.  Adam Grant is being seen in follow up after being seen in consultation for management of currently uncontrolled symptomatic diabetes requested by  Bonnita Hollow, MD.   Past Medical History:  Diagnosis Date   Diabetes mellitus without complication ([place])    Hypertension    Obstructive sleep apnea     Past Surgical History:  Procedure Laterality Date   EYE SURGERY Bilateral    1978, 2001   SPINE SURGERY     Spine Cancer 2019    Social History   Socioeconomic History   Marital status: Divorced    Spouse name: Not on file   Number of children: 3   Years of education: Not on file   Highest education level: Not on file  Occupational History   Occupation: elect. tech  Tobacco Use   Smoking status: Never    Passive exposure: Past   Smokeless tobacco: Never  Vaping Use   Vaping Use: Never used  Substance and Sexual Activity   Alcohol use: Yes    Comment: ocass.    Drug use: No   Sexual activity: Not on file  Other Topics Concern   Not on file  Social History Narrative   Not on file   Social Determinants of Health   Financial Resource Strain: Not on file  Food Insecurity: Not on file  Transportation Needs: Not on file  Physical Activity: Not on file  Stress: Not on file  Social Connections: Not on file    Family History  Problem Relation Age of Onset   Cancer Mother    Hypertension Mother    Hyperlipidemia Mother    Hypertension Father    Cancer Father    Heart attack Father    Heart failure Father     Outpatient Encounter Medications as of 10/18/2021  Medication Sig   ACCU-CHEK GUIDE test strip Use to monitor glucose 4 times daily, before meals and before bed.   Accu-Chek Softclix Lancets lancets Use as instructed to monitor glucose 4 times daily    acetaminophen (TYLENOL) 325 MG tablet Take by mouth.   amLODipine (NORVASC) 10 MG tablet    aspirin EC 81 MG tablet Take by mouth.   atorvastatin (LIPITOR) 40 MG tablet Take by mouth.   Cholecalciferol (VITAMIN D3 PO) Take by mouth.   glipiZIDE (GLUCOTROL XL) 10 MG 24 hr tablet Take 10 mg by mouth daily.   Multiple Vitamins-Minerals (COMPLETE) TABS Take by mouth.   oxycodone (OXY-IR) 5 MG capsule Take 5 mg by mouth as needed.   sulfamethoxazole-trimethoprim (BACTRIM DS) 800-160 MG tablet Take 1 tablet by mouth 2 (two) times daily.   tamsulosin (FLOMAX) 0.4 MG CAPS capsule Take by mouth.   hydrochlorothiazide (HYDRODIURIL) 25 MG tablet Take by mouth.   No facility-administered encounter medications on file as of 10/18/2021.    ALLERGIES: Allergies  Allergen Reactions   Farxiga [Dapagliflozin] Rash   Losartan Swelling, Palpitations and Rash   Penicillins Itching and Rash   Tetracyclines & Related Rash  VACCINATION STATUS: Immunization History  Administered Date(s) Administered   PFIZER Comirnaty(Gray Top)Covid-19 Tri-Sucrose Vaccine 10/24/2019, 11/15/2019, 07/20/2020   PFIZER(Purple Top)SARS-COV-2 Vaccination 10/24/2019    Diabetes He presents for his follow-up diabetic visit. He has type 2 diabetes mellitus. Onset time: Diagnosed at approx age of 89. His disease course has been improving. There are no hypoglycemic associated symptoms. Associated symptoms include fatigue. Pertinent negatives for diabetes include no blurred vision, no foot ulcerations, no polydipsia and no polyuria. There are no hypoglycemic complications. Symptoms are improving. Diabetic complications include nephropathy and retinopathy. Risk factors for coronary artery disease include diabetes mellitus, dyslipidemia, family history, male sex, obesity, hypertension and sedentary lifestyle. Current diabetic treatment includes oral agent (monotherapy). He is compliant with treatment most of the time. His weight is  fluctuating minimally. He is following a generally unhealthy diet. When asked about meal planning, he reported none. He has had a previous visit with a dietitian (has seen dietician in distant past). His home blood glucose trend is decreasing steadily. His breakfast blood glucose range is generally 110-130 mg/dl. His lunch blood glucose range is generally 180-200 mg/dl. His dinner blood glucose range is generally 140-180 mg/dl. His bedtime blood glucose range is generally 140-180 mg/dl. His overall blood glucose range is 140-180 mg/dl. (He presents today with his logs, no meter, showing drastically improved glycemic profile overall.  His POCT A1c today is 7.6%, improving from last visit of 12.6%.  He has cut out most simple carbs from his diet.  He is keeping a detailed food log to help him understand how his glucose reacts to foods.  He is having surgery for kidney stone removal soon.  He denies any hypoglycemia.) An ACE inhibitor/angiotensin II receptor blocker is not being taken. He does not see a podiatrist.Eye exam is current.  Hyperlipidemia This is a chronic problem. The current episode started more than 1 year ago. Exacerbating diseases include chronic renal disease, diabetes and obesity. Factors aggravating his hyperlipidemia include thiazides and fatty foods. Current antihyperlipidemic treatment includes statins. Compliance problems include adherence to diet and adherence to exercise.  Risk factors for coronary artery disease include diabetes mellitus, dyslipidemia, family history, male sex, obesity, hypertension and a sedentary lifestyle.  Hypertension This is a chronic problem. The current episode started more than 1 year ago. The problem is unchanged. The problem is uncontrolled. Pertinent negatives include no blurred vision. There are no associated agents to hypertension. Risk factors for coronary artery disease include dyslipidemia, diabetes mellitus, family history, obesity, male gender and  sedentary lifestyle. Past treatments include diuretics and calcium channel blockers. The current treatment provides mild improvement. Compliance problems include diet and exercise.  Hypertensive end-organ damage includes kidney disease and retinopathy. Identifiable causes of hypertension include chronic renal disease and sleep apnea.    Review of systems  Constitutional: + minimally fluctuating weight,  current Body mass index is 49.09 kg/m. , no fatigue, no subjective hyperthermia, no subjective hypothermia Eyes: no blurry vision, no xerophthalmia ENT: no sore throat, no nodules palpated in throat, no dysphagia/odynophagia, no hoarseness Cardiovascular: no chest pain, no shortness of breath, no palpitations, no leg swelling Respiratory: no cough, no shortness of breath Gastrointestinal: no nausea/vomiting/diarrhea Musculoskeletal: + generalized muscle aches- walks with cane Skin: no rashes, no hyperemia, recent laceration to right first finger Neurological: no tremors, no numbness, no tingling, no dizziness Psychiatric: no depression, no anxiety  Objective:     BP (!) 143/73    Pulse 68    Ht 5\' 11"  (1.803 m)  Wt (!) 352 lb (159.7 kg)    SpO2 97%    BMI 49.09 kg/m   Wt Readings from Last 3 Encounters:  10/18/21 (!) 352 lb (159.7 kg)  10/17/21 (!) 345 lb (156.5 kg)  10/03/21 (!) 352 lb 12.8 oz (160 kg)     BP Readings from Last 3 Encounters:  10/18/21 (!) 143/73  10/17/21 121/69  10/03/21 (!) 149/71     Physical Exam- Limited  Constitutional:  Body mass index is 49.09 kg/m. , not in acute distress, distracted state of mind with frequent interruptions Eyes:  EOMI, no exophthalmos Neck: Supple Musculoskeletal: no gross deformities, strength intact in all four extremities, no gross restriction of joint movements, walks with cane Skin:  no rashes, no hyperemia, compression bandage on right hand first digit Neurological: no tremor with outstretched hands    CMP ( most  recent) CMP     Component Value Date/Time   BUN 23 (A) 11/09/2020 0000   CREATININE 1.8 (A) 11/09/2020 0000   ALKPHOS 131 (A) 11/09/2020 0000   GFRNONAA 40 11/09/2020 0000     Diabetic Labs (most recent): Lab Results  Component Value Date   HGBA1C 7.6 (A) 10/18/2021   HGBA1C 12.6 05/25/2021   HGBA1C 6.7 11/09/2020     Lipid Panel ( most recent) Lipid Panel  No results found for: CHOL, TRIG, HDL, CHOLHDL, VLDL, LDLCALC, LDLDIRECT, LABVLDL    No results found for: TSH, FREET4         Assessment & Plan:   1) Type 2 diabetes mellitus with stage 3b chronic kidney disease, without long-term current use of insulin ([place])  He presents today with his logs, no meter, showing drastically improved glycemic profile overall.  His POCT A1c today is 7.6%, improving from last visit of 12.6%.  He has cut out most simple carbs from his diet.  He is keeping a detailed food log to help him understand how his glucose reacts to foods.  He is having surgery for kidney stone removal soon.  He denies any hypoglycemia.    - Adam Grant has currently uncontrolled symptomatic type 2 DM since 66 years of age.   -Recent labs reviewed.  - I had a long discussion with him about the progressive nature of diabetes and the pathology behind its complications. -his diabetes is complicated by nonhealing diabetic foot ulcer, PAD, CKD, retinopathy and he remains at a high risk for more acute and chronic complications which include CAD, CVA, CKD, retinopathy, and neuropathy. These are all discussed in detail with him.  - Nutritional counseling repeated at each appointment due to patients tendency to fall back in to old habits.  - The patient admits there is a room for improvement in their diet and drink choices. -  Suggestion is made for the patient to avoid simple carbohydrates from their diet including Cakes, Sweet Desserts / Pastries, Ice Cream, Soda (diet and regular), Sweet Tea, Candies, Chips, Cookies,  Sweet Pastries, Store Bought Juices, Alcohol in Excess of 1-2 drinks a day, Artificial Sweeteners, Coffee Creamer, and "Sugar-free" Products. This will help patient to have stable blood glucose profile and potentially avoid unintended weight gain.   - I encouraged the patient to switch to unprocessed or minimally processed complex starch and increased protein intake (animal or plant source), fruits, and vegetables.   - Patient is advised to stick to a routine mealtimes to eat 3 meals a day and avoid unnecessary snacks (to snack only to correct hypoglycemia).  - I  have approached him with the following individualized plan to manage his diabetes and patient agrees:   -He is encouraged to continue monitoring blood glucose twice daily, at breakfast and before bed, and to call the clinic if he has readings less than 70 or above 300 for 3 tests in a row.  - Adjustment parameters are given to him for hypo and hyperglycemia in writing.  - Given his improved glycemic profile overall, he is advised to continue Glipizide 10 mg ER daily with breakfast , therapeutically suitable for patient .  - he is not a candidate for Metformin due to concurrent renal insufficiency.  - he did not tolerate Metformin, Rybelsus, Tradjenta, or Farxiga in the past.  - Specific targets for  A1c; LDL, HDL, and Triglycerides were discussed with the patient.  2) Blood Pressure /Hypertension:  his blood pressure is controlled to target.   he is advised to continue his current medications including Norvasc 10 mg p.o. daily with breakfast, and HCTZ 25 mg po daily.  3) Lipids/Hyperlipidemia:    There is no recent lipid panel to review.  he is advised to continue Lipitor 40 mg daily at bedtime.  Side effects and precautions discussed with him.  4)  Weight/Diet:  his Body mass index is 49.09 kg/m.  -  clearly complicating his diabetes care.   he is a candidate for weight loss. I discussed with him the fact that loss of 5 - 10% of  his  current body weight will have the most impact on his diabetes management.  Exercise, and detailed carbohydrates information provided  -  detailed on discharge instructions.  5) Chronic Care/Health Maintenance: -he is not on ACEI/ARB and is on Statin medications and is encouraged to initiate and continue to follow up with Ophthalmology, Dentist, Podiatrist at least yearly or according to recommendations, and advised to stay away from smoking. I have recommended yearly flu vaccine and pneumonia vaccine at least every 5 years; moderate intensity exercise for up to 150 minutes weekly; and sleep for at least 7 hours a day.  - he is advised to maintain close follow up with Bonnita Hollow, MD for primary care needs, as well as his other providers for optimal and coordinated care.     I spent 40 minutes in the care of the patient today including review of labs from [place], Lipids, Thyroid Function, Hematology (current and previous including abstractions from other facilities); face-to-face time discussing  his blood glucose readings/logs, discussing hypoglycemia and hyperglycemia episodes and symptoms, medications doses, his options of short and long term treatment based on the latest standards of care / guidelines;  discussion about incorporating lifestyle medicine;  and documenting the encounter.    Please refer to Patient Instructions for Blood Glucose Monitoring and Insulin/Medications Dosing Guide"  in media tab for additional information. Please  also refer to " Patient Self Inventory" in the Media  tab for reviewed elements of pertinent patient history.  Graylin Shiver participated in the discussions, expressed understanding, and voiced agreement with the above plans.  All questions were answered to his satisfaction. he is encouraged to contact clinic should he have any questions or concerns prior to his return visit.     Follow up plan: - Return in about 3 months (around 01/15/2022) for  Diabetes F/U with A1c in office, No previsit labs, Bring meter and logs.    Rayetta Pigg, [facility] [address] [place], [place] [address] Phone: [phone] Fax: [phone]  10/18/2021, 3:51 PM

## 2021-10-19 DIAGNOSIS — I10 Essential (primary) hypertension: Secondary | ICD-10-CM | POA: Diagnosis not present

## 2021-10-19 DIAGNOSIS — N200 Calculus of kidney: Secondary | ICD-10-CM | POA: Diagnosis not present

## 2021-10-19 DIAGNOSIS — Z79899 Other long term (current) drug therapy: Secondary | ICD-10-CM | POA: Diagnosis not present

## 2021-10-19 DIAGNOSIS — E119 Type 2 diabetes mellitus without complications: Secondary | ICD-10-CM | POA: Diagnosis not present

## 2021-10-19 DIAGNOSIS — E785 Hyperlipidemia, unspecified: Secondary | ICD-10-CM | POA: Diagnosis not present

## 2021-10-19 DIAGNOSIS — N202 Calculus of kidney with calculus of ureter: Secondary | ICD-10-CM | POA: Diagnosis not present

## 2021-10-23 DIAGNOSIS — I129 Hypertensive chronic kidney disease with stage 1 through stage 4 chronic kidney disease, or unspecified chronic kidney disease: Secondary | ICD-10-CM | POA: Diagnosis not present

## 2021-10-23 DIAGNOSIS — E785 Hyperlipidemia, unspecified: Secondary | ICD-10-CM | POA: Diagnosis not present

## 2021-10-23 DIAGNOSIS — E1122 Type 2 diabetes mellitus with diabetic chronic kidney disease: Secondary | ICD-10-CM | POA: Diagnosis not present

## 2021-10-23 DIAGNOSIS — Z7982 Long term (current) use of aspirin: Secondary | ICD-10-CM | POA: Diagnosis not present

## 2021-10-23 DIAGNOSIS — N183 Chronic kidney disease, stage 3 unspecified: Secondary | ICD-10-CM | POA: Diagnosis not present

## 2021-10-23 DIAGNOSIS — Z7984 Long term (current) use of oral hypoglycemic drugs: Secondary | ICD-10-CM | POA: Diagnosis not present

## 2021-10-23 DIAGNOSIS — N202 Calculus of kidney with calculus of ureter: Secondary | ICD-10-CM | POA: Diagnosis not present

## 2021-10-23 DIAGNOSIS — G4733 Obstructive sleep apnea (adult) (pediatric): Secondary | ICD-10-CM | POA: Diagnosis not present

## 2021-10-23 DIAGNOSIS — J939 Pneumothorax, unspecified: Secondary | ICD-10-CM | POA: Diagnosis not present

## 2021-10-23 DIAGNOSIS — Z466 Encounter for fitting and adjustment of urinary device: Secondary | ICD-10-CM | POA: Diagnosis not present

## 2021-10-23 DIAGNOSIS — E7800 Pure hypercholesterolemia, unspecified: Secondary | ICD-10-CM | POA: Diagnosis not present

## 2021-10-23 DIAGNOSIS — N200 Calculus of kidney: Secondary | ICD-10-CM | POA: Diagnosis not present

## 2021-10-31 DIAGNOSIS — N183 Chronic kidney disease, stage 3 unspecified: Secondary | ICD-10-CM | POA: Diagnosis not present

## 2021-10-31 DIAGNOSIS — E1165 Type 2 diabetes mellitus with hyperglycemia: Secondary | ICD-10-CM | POA: Diagnosis not present

## 2021-11-08 ENCOUNTER — Ambulatory Visit: Payer: Medicare Other | Admitting: Vascular Surgery

## 2021-11-08 DIAGNOSIS — N200 Calculus of kidney: Secondary | ICD-10-CM | POA: Diagnosis not present

## 2021-11-08 DIAGNOSIS — Z466 Encounter for fitting and adjustment of urinary device: Secondary | ICD-10-CM | POA: Diagnosis not present

## 2021-11-09 DIAGNOSIS — E119 Type 2 diabetes mellitus without complications: Secondary | ICD-10-CM | POA: Diagnosis not present

## 2021-11-09 DIAGNOSIS — H2513 Age-related nuclear cataract, bilateral: Secondary | ICD-10-CM | POA: Diagnosis not present

## 2021-11-09 DIAGNOSIS — Z7984 Long term (current) use of oral hypoglycemic drugs: Secondary | ICD-10-CM | POA: Diagnosis not present

## 2021-11-09 LAB — HM DIABETES EYE EXAM

## 2021-11-12 DIAGNOSIS — G4733 Obstructive sleep apnea (adult) (pediatric): Secondary | ICD-10-CM | POA: Diagnosis not present

## 2021-11-13 DIAGNOSIS — S68629A Partial traumatic transphalangeal amputation of unspecified finger, initial encounter: Secondary | ICD-10-CM | POA: Diagnosis not present

## 2021-11-16 DIAGNOSIS — N200 Calculus of kidney: Secondary | ICD-10-CM | POA: Diagnosis not present

## 2021-11-16 DIAGNOSIS — N2581 Secondary hyperparathyroidism of renal origin: Secondary | ICD-10-CM | POA: Diagnosis not present

## 2021-11-16 DIAGNOSIS — N183 Chronic kidney disease, stage 3 unspecified: Secondary | ICD-10-CM | POA: Diagnosis not present

## 2021-11-16 DIAGNOSIS — I10 Essential (primary) hypertension: Secondary | ICD-10-CM | POA: Diagnosis not present

## 2021-11-17 DIAGNOSIS — N200 Calculus of kidney: Secondary | ICD-10-CM | POA: Diagnosis not present

## 2021-11-22 DIAGNOSIS — I878 Other specified disorders of veins: Secondary | ICD-10-CM | POA: Diagnosis not present

## 2021-11-22 DIAGNOSIS — I10 Essential (primary) hypertension: Secondary | ICD-10-CM | POA: Diagnosis not present

## 2021-11-22 DIAGNOSIS — E1165 Type 2 diabetes mellitus with hyperglycemia: Secondary | ICD-10-CM | POA: Diagnosis not present

## 2021-11-22 DIAGNOSIS — N200 Calculus of kidney: Secondary | ICD-10-CM | POA: Diagnosis not present

## 2021-11-23 DIAGNOSIS — G4733 Obstructive sleep apnea (adult) (pediatric): Secondary | ICD-10-CM | POA: Diagnosis not present

## 2021-11-29 ENCOUNTER — Other Ambulatory Visit: Payer: Self-pay

## 2021-11-29 ENCOUNTER — Ambulatory Visit: Payer: Medicare Other | Admitting: Vascular Surgery

## 2021-11-29 ENCOUNTER — Encounter: Payer: Self-pay | Admitting: Vascular Surgery

## 2021-11-29 VITALS — BP 177/79 | HR 84 | Temp 98.4°F | Resp 20 | Ht 71.0 in | Wt 344.0 lb

## 2021-11-29 DIAGNOSIS — I872 Venous insufficiency (chronic) (peripheral): Secondary | ICD-10-CM | POA: Diagnosis not present

## 2021-11-29 NOTE — Progress Notes (Signed)
? ?Patient ID: Adam Grant, male   DOB: 1956-03-01, 66 y.o.   MRN: [id number] ? ?Reason for Consult: Follow-up ?  ?Referred by Bonnita Hollow, MD ? ?Subjective:  ?   ?HPI: ? ?Adam Grant is a 66 y.o. male with a history of laser ablation of both greater saphenous veins in the 1990s to help heal ulcers.  He has had persistent swelling of his lower extremities prior to his last visit here he was wearing a greater than 35 year old compression stockings but since then he has bought knee-high compression stockings and he has been compliant with these.  He does walk with the help of a cane but does not walk very far due to previous spine surgery.  He has a history of DVT in the year 2000 related to trauma but no family history of DVTs.  He does have family history of lower extremity venous reflux disease with multiple men in his family having been treated.  He does not currently have any active tissue loss or ulceration with only minimal skin changes. ? ?Past Medical History:  ?Diagnosis Date  ? Diabetes mellitus without complication ([place])   ? Hypertension   ? Obstructive sleep apnea   ? ?Family History  ?Problem Relation Age of Onset  ? Cancer Mother   ? Hypertension Mother   ? Hyperlipidemia Mother   ? Hypertension Father   ? Cancer Father   ? Heart attack Father   ? Heart failure Father   ? ?Past Surgical History:  ?Procedure Laterality Date  ? EYE SURGERY Bilateral   ? 1978, 2001  ? SPINE SURGERY    ? Spine Cancer 2019  ? ? ?Short Social History:  ?Social History  ? ?Tobacco Use  ? Smoking status: Never  ?  Passive exposure: Past  ? Smokeless tobacco: Never  ?Substance Use Topics  ? Alcohol use: Yes  ?  Comment: ocass.   ? ? ?Allergies  ?Allergen Reactions  ? Wilder Glade [Dapagliflozin] Rash  ? Losartan Swelling, Palpitations and Rash  ? Penicillins Itching and Rash  ? Tetracyclines & Related Rash  ? ? ?Current Outpatient Medications  ?Medication Sig Dispense Refill  ? ACCU-CHEK GUIDE test strip Use to monitor  glucose 4 times daily, before meals and before bed. 200 strip 3  ? Accu-Chek Softclix Lancets lancets Use as instructed to monitor glucose 4 times daily 100 each 12  ? acetaminophen (TYLENOL) 325 MG tablet Take by mouth.    ? amLODipine (NORVASC) 10 MG tablet     ? aspirin EC 81 MG tablet Take by mouth.    ? atorvastatin (LIPITOR) 40 MG tablet Take by mouth.    ? Cholecalciferol (VITAMIN D3 PO) Take by mouth.    ? glipiZIDE (GLUCOTROL XL) 10 MG 24 hr tablet Take 10 mg by mouth daily.    ? Multiple Vitamins-Minerals (COMPLETE) TABS Take by mouth.    ? oxycodone (OXY-IR) 5 MG capsule Take 5 mg by mouth as needed.    ? sulfamethoxazole-trimethoprim (BACTRIM DS) 800-160 MG tablet Take 1 tablet by mouth 2 (two) times daily.    ? tamsulosin (FLOMAX) 0.4 MG CAPS capsule Take by mouth.    ? hydrochlorothiazide (HYDRODIURIL) 25 MG tablet Take by mouth.    ? ?No current facility-administered medications for this visit.  ? ? ?Review of Systems  ?Constitutional:  Constitutional negative. ?HENT: HENT negative.  ?Eyes: Eyes negative.  ?Respiratory: Respiratory negative.  ?Cardiovascular: Positive for leg swelling.  ?GI: Gastrointestinal negative.  ?  Musculoskeletal: Positive for leg pain.  ?Neurological: Neurological negative. ?Hematologic: Hematologic/lymphatic negative.  ?Psychiatric: Psychiatric negative.   ? ?   ?Objective:  ?Objective  ? ?Vitals:  ? 11/29/21 1153  ?BP: (!) 177/79  ?Pulse: 84  ?Resp: 20  ?Temp: 98.4 ?F (36.9 ?C)  ?SpO2: 96%  ?Weight: (!) 344 lb (156 kg)  ?Height: '5\' 11"'$  (1.803 m)  ? ?Body mass index is 47.98 kg/m?. ? ?Physical Exam ?Constitutional:   ?   Appearance: He is obese.  ?HENT:  ?   Head: Normocephalic.  ?   Nose: Nose normal.  ?Eyes:  ?   Pupils: Pupils are equal, round, and reactive to light.  ?Abdominal:  ?   General: Abdomen is flat.  ?   Palpations: Abdomen is soft.  ?Musculoskeletal:     ?   General: Normal range of motion.  ?   Right lower leg: Edema present.  ?   Left lower leg: Edema present.   ?Skin: ?   General: Skin is warm and dry.  ?   Capillary Refill: Capillary refill takes less than 2 seconds.  ?Neurological:  ?   General: No focal deficit present.  ?   Mental Status: He is alert.  ?Psychiatric:     ?   Mood and Affect: Mood normal.  ? ? ?Data: ?      ?              No       Yes     Time                                   ?      ?+--------------+--------+------+----------+------------+-------------------  ?----+  ?CFV                    yes  >1 second                                 ?      ?+--------------+--------+------+----------+------------+-------------------  ?----+  ?FV prox       no                                                      ?      ?+--------------+--------+------+----------+------------+-------------------  ?----+  ?FV mid        no                                                      ?      ?+--------------+--------+------+----------+------------+-------------------  ?----+  ?FV dist       no                                                      ?      ?+--------------+--------+------+----------+------------+-------------------  ?----+  ?Popliteal     no                                                      ?      ?+--------------+--------+------+----------+------------+-------------------  ?----+  ?  GSV at [facility]             yes   >500 ms     0.804                        ?      ?+--------------+--------+------+----------+------------+-------------------  ?----+  ?GSV prox thigh         yes   >500 ms     0.675                        ?      ?+--------------+--------+------+----------+------------+-------------------  ?----+  ?GSV mid thigh          yes   >500 ms     0.623                        ?      ?+--------------+--------+------+----------+------------+-------------------  ?----+  ?GSV dist thigh                                    prior                ?      ?                                                   ablation/stripping  ?      ?+--------------+--------+------+----------+------------+-------------------  ?----+  ?SSV Pop Fossa no                         0.449                        ?      ?+--------------+--------+------+----------+------------+-------------------  ?----+  ?SSV prox calf no                         0.453                        ?      ?+--------------+--------+------+----------+------------+-------------------  ?----+  ?SSV mid calf  no                         0.318                        ?      ?+--------------+--------+------+----------+------------+-------------------  ?----+  ? ?   ?Summary:  ?Right:  ?- No evidence of deep vein thrombosis seen in the right lower extremity,  ?from the common femoral through the popliteal veins.  ?- No evidence of superficial venous thrombosis in the right lower  ?extremity.  ?   ?-Deep vein reflux in the CFV.  ?- Superficial vein reflux in the SFJ and GSV to the mid thigh. Varicosed  ?branch from the mid to distal thigh.  ?    ?Assessment/Plan:  ?  ?66 year old male here with recurrent venous insufficiency of the left greater saphenous vein although only in the thigh segment.  Most of his swelling is related to the left leg this is mostly controlled now with compression  socks and he does not have any recurrent varicosities no current skin ulceration.  Patient would benefit from weight loss and continued compression and elevation of his legs as well as exercise program.  We discussed this today and he demonstrates good understanding and he can follow-up with me on an as-needed basis. ? ?  ? ?Waynetta Sandy MD ?[facility] ? ? ?

## 2021-12-27 ENCOUNTER — Ambulatory Visit: Payer: Medicare Other | Admitting: Urology

## 2021-12-27 ENCOUNTER — Encounter: Payer: Self-pay | Admitting: Urology

## 2021-12-27 VITALS — BP 146/79 | HR 84

## 2021-12-27 DIAGNOSIS — N5089 Other specified disorders of the male genital organs: Secondary | ICD-10-CM | POA: Diagnosis not present

## 2021-12-27 LAB — URINALYSIS, ROUTINE W REFLEX MICROSCOPIC
Bilirubin, UA: NEGATIVE
Glucose, UA: NEGATIVE
Ketones, UA: NEGATIVE
Leukocytes,UA: NEGATIVE
Nitrite, UA: NEGATIVE
Specific Gravity, UA: >1.03 — ABNORMAL HIGH (ref 1.005–1.030)
Urobilinogen, Ur: 0.2 mg/dL (ref 0.2–1.0)
pH, UA: 5.5 (ref 5.0–7.5)

## 2021-12-27 LAB — MICROSCOPIC EXAMINATION
Bacteria, UA: NONE SEEN
Epithelial Cells (non renal): NONE SEEN /hpf (ref 0–10)
Renal Epithel, UA: NONE SEEN /hpf
WBC, UA: NONE SEEN /hpf (ref 0–5)

## 2021-12-27 NOTE — Progress Notes (Signed)
? ?12/27/2021 ?2:59 PM  ? ?Adam Grant ?[birth year] ?[id number] ? ?Referring provider: Valera Castle, MD ?[address] ?[place],  [place] [address] ? ?Followup testicular mass ? ? ?HPI: ?Adam Grant is a 65yo here for followup for a testicular mass/epididymitis. He has had no issues with right testicular pain, swelling. He cannot feel the mass any longer after antibiotic therapy. No other complaints today ? ? ?PMH: ?Past Medical History:  ?Diagnosis Date  ? Diabetes mellitus without complication ([place])   ? Hypertension   ? Obstructive sleep apnea   ? ? ?Surgical History: ?Past Surgical History:  ?Procedure Laterality Date  ? EYE SURGERY Bilateral   ? 1978, 2001  ? SPINE SURGERY    ? Spine Cancer 2019  ? ? ?Home Medications:  ?Allergies as of 12/27/2021   ? ?   Reactions  ? Wilder Glade [dapagliflozin] Rash  ? Losartan Swelling, Palpitations, Rash  ? Penicillins Itching, Rash  ? Tetracyclines & Related Rash  ? ?  ? ?  ?Medication List  ?  ? ?  ? Accurate as of December 27, 2021  2:59 PM. If you have any questions, ask your nurse or doctor.  ?  ?  ? ?  ? ?Accu-Chek Guide test strip ?Generic drug: glucose blood ?Use to monitor glucose 4 times daily, before meals and before bed. ?  ?Accu-Chek Softclix Lancets lancets ?Use as instructed to monitor glucose 4 times daily ?  ?acetaminophen 325 MG tablet ?Commonly known as: TYLENOL ?Take by mouth. ?  ?amLODipine 10 MG tablet ?Commonly known as: NORVASC ?  ?aspirin EC 81 MG tablet ?Take by mouth. ?  ?atorvastatin 40 MG tablet ?Commonly known as: LIPITOR ?Take by mouth. ?  ?Complete Tabs ?Take by mouth. ?  ?glipiZIDE 10 MG 24 hr tablet ?Commonly known as: GLUCOTROL XL ?Take 10 mg by mouth daily. ?  ?hydrochlorothiazide 25 MG tablet ?Commonly known as: HYDRODIURIL ?Take by mouth. ?  ?meloxicam 15 MG tablet ?Commonly known as: MOBIC ?Take 15 mg by mouth daily. ?  ?oxycodone 5 MG capsule ?Commonly known as: OXY-IR ?Take 5 mg by mouth as needed. ?  ?sulfamethoxazole-trimethoprim 800-160  MG tablet ?Commonly known as: BACTRIM DS ?Take 1 tablet by mouth 2 (two) times daily. ?  ?tamsulosin 0.4 MG Caps capsule ?Commonly known as: FLOMAX ?Take by mouth. ?  ?VITAMIN D3 PO ?Take by mouth. ?  ? ?  ? ? ?Allergies:  ?Allergies  ?Allergen Reactions  ? Wilder Glade [Dapagliflozin] Rash  ? Losartan Swelling, Palpitations and Rash  ? Penicillins Itching and Rash  ? Tetracyclines & Related Rash  ? ? ?Family History: ?Family History  ?Problem Relation Age of Onset  ? Cancer Mother   ? Hypertension Mother   ? Hyperlipidemia Mother   ? Hypertension Father   ? Cancer Father   ? Heart attack Father   ? Heart failure Father   ? ? ?Social History:  reports that he has never smoked. He has been exposed to tobacco smoke. He has never used smokeless tobacco. He reports current alcohol use. He reports that he does not use drugs. ? ?ROS: ?All other review of systems were reviewed and are negative except what is noted above in HPI ? ?Physical Exam: ?BP (!) 176/74   Pulse 84   ?Constitutional:  Alert and oriented, No acute distress. ?HEENT: [place] AT, moist mucus membranes.  Trachea midline, no masses. ?Cardiovascular: No clubbing, cyanosis, or edema. ?Respiratory: Normal respiratory effort, no increased work of breathing. ?GI: Abdomen  is soft, nontender, nondistended, no abdominal masses ?GU: No CVA tenderness.  ?Lymph: No cervical or inguinal lymphadenopathy. ?Skin: No rashes, bruises or suspicious lesions. ?Neurologic: Grossly intact, no focal deficits, moving all 4 extremities. ?Psychiatric: Normal mood and affect. ? ?Laboratory Data: ?No results found for: WBC, HGB, HCT, MCV, PLT ? ?Lab Results  ?Component Value Date  ? CREATININE 1.8 (A) 11/09/2020  ? ? ?No results found for: PSA ? ?No results found for: TESTOSTERONE ? ?Lab Results  ?Component Value Date  ? HGBA1C 7.6 (A) 10/18/2021  ? ? ?Urinalysis ?   ?Component Value Date/Time  ? APPEARANCEUR Clear 11/16/2020 1330  ? GLUCOSEU Trace (A) 11/16/2020 1330  ? BILIRUBINUR Negative  11/16/2020 1330  ? PROTEINUR Trace (A) 11/16/2020 1330  ? NITRITE Negative 11/16/2020 1330  ? LEUKOCYTESUR Negative 11/16/2020 1330  ? ? ?Lab Results  ?Component Value Date  ? LABMICR See below: 11/16/2020  ? [place] None seen 11/16/2020  ? LABEPIT 0-10 11/16/2020  ? MUCUS Present 11/16/2020  ? BACTERIA Few (A) 11/16/2020  ? ? ?Pertinent Imaging: ? ?No results found for this or any previous visit. ? ?No results found for this or any previous visit. ? ?No results found for this or any previous visit. ? ?No results found for this or any previous visit. ? ?Results for orders placed during the hospital encounter of 02/16/19 ? ?US RENAL ? ?Narrative ?CLINICAL DATA:  Chronic renal disease. ? ?EXAM: ?RENAL / URINARY TRACT ULTRASOUND COMPLETE ? ?COMPARISON:  No prior. ? ?FINDINGS: ?Right Kidney: ? ?Renal measurements: 13.5 x 6.1 x 6.0 cm = volume: 259.5 mL . ?Echogenicity within normal limits. No mass or hydronephrosis ?visualized. ? ?Left Kidney: ? ?Renal measurements: 13.9 x 5.0 x 5.5 cm = volume: 197.8 mL. ?Echogenicity within normal limits. No mass or hydronephrosis ?visualized. 10.4 mm stone. ? ?Bladder: ? ?Appears normal for degree of bladder distention. ? ?IMPRESSION: ?1.  10.4 mm stone left kidney. ? ?2.  Exam otherwise unremarkable. ? ? ?Electronically Signed ?By: Reynolds ?On: 02/17/2019 08:09 ? ?No results found for this or any previous visit. ? ?No results found for this or any previous visit. ? ?No results found for this or any previous visit. ? ? ?Assessment & Plan:   ? ?1. Testicular mass ?-resolved ?- Urinalysis, Routine w reflex microscopic ? ?2. Nephrolithiasis ?-Management per [facility] ? ? ?No follow-ups on file. ? ?Adam Bang, MD ? ?[facility] [place] ?  ?

## 2021-12-27 NOTE — Patient Instructions (Signed)
Dietary Guidelines to Help Prevent Kidney Stones Kidney stones are deposits of minerals and salts that form inside your kidneys. Your risk of developing kidney stones may be greater depending on your diet, your lifestyle, the medicines you take, and whether you have certain medical conditions. Most people can lower their chances of developing kidney stones by following the instructions below. Your dietitian may give you more specific instructions depending on your overall health and the type of kidney stones you tend to develop. What are tips for following this plan? Reading food labels  Choose foods with "no salt added" or "low-salt" labels. Limit your salt (sodium) intake to less than 1,500 mg a day. Choose foods with calcium for each meal and snack. Try to eat about 300 mg of calcium at each meal. Foods that contain 200-500 mg of calcium a serving include: 8 oz (237 mL) of milk, calcium-fortifiednon-dairy milk, and calcium-fortifiedfruit juice. Calcium-fortified means that calcium has been added to these drinks. 8 oz (237 mL) of kefir, yogurt, and soy yogurt. 4 oz (114 g) of tofu. 1 oz (28 g) of cheese. 1 cup (150 g) of dried figs. 1 cup (91 g) of cooked broccoli. One 3 oz (85 g) can of sardines or mackerel. Most people need 1,000-1,500 mg of calcium a day. Talk to your dietitian about how much calcium is recommended for you. Shopping Buy plenty of fresh fruits and vegetables. Most people do not need to avoid fruits and vegetables, even if these foods contain nutrients that may contribute to kidney stones. When shopping for convenience foods, choose: Whole pieces of fruit. Pre-made salads with dressing on the side. Low-fat fruit and yogurt smoothies. Avoid buying frozen meals or prepared deli foods. These can be high in sodium. Look for foods with live cultures, such as yogurt and kefir. Choose high-fiber grains, such as whole-wheat breads, oat bran, and wheat cereals. Cooking Do not add  salt to food when cooking. Place a salt shaker on the table and allow each person to add his or her own salt to taste. Use vegetable protein, such as beans, textured vegetable protein (TVP), or tofu, instead of meat in pasta, casseroles, and soups. Meal planning Eat less salt, if told by your dietitian. To do this: Avoid eating processed or pre-made food. Avoid eating fast food. Eat less animal protein, including cheese, meat, poultry, or fish, if told by your dietitian. To do this: Limit the number of times you have meat, poultry, fish, or cheese each week. Eat a diet free of meat at least 2 days a week. Eat only one serving each day of meat, poultry, fish, or seafood. When you prepare animal protein, cut pieces into small portion sizes. For most meat and fish, one serving is about the size of the palm of your hand. Eat at least five servings of fresh fruits and vegetables each day. To do this: Keep fruits and vegetables on hand for snacks. Eat one piece of fruit or a handful of berries with breakfast. Have a salad and fruit at lunch. Have two kinds of vegetables at dinner. Limit foods that are high in a substance called oxalate. These include: Spinach (cooked), rhubarb, beets, sweet potatoes, and Swiss chard. Peanuts. Potato chips, french fries, and baked potatoes with skin on. Nuts and nut products. Chocolate. If you regularly take a diuretic medicine, make sure to eat at least 1 or 2 servings of fruits or vegetables that are high in potassium each day. These include: Avocado. Banana. Orange, prune,   carrot, or tomato juice. Baked potato. Cabbage. Beans and split peas. Lifestyle  Drink enough fluid to keep your urine pale yellow. This is the most important thing you can do. Spread your fluid intake throughout the day. If you drink alcohol: Limit how much you use to: 0-1 drink a day for women who are not pregnant. 0-2 drinks a day for men. Be aware of how much alcohol is in your  drink. In the [place] one drink equals one 12 oz bottle of beer (355 mL), one 5 oz glass of wine (148 mL), or one 1 oz glass of hard liquor (44 mL). Lose weight if told by your health care provider. Work with your dietitian to find an eating plan and weight loss strategies that work best for you. General information Talk to your health care provider and dietitian about taking daily supplements. You may be told the following depending on your health and the cause of your kidney stones: Not to take supplements with vitamin C. To take a calcium supplement. To take a daily probiotic supplement. To take other supplements such as magnesium, fish oil, or vitamin B6. Take over-the-counter and prescription medicines only as told by your health care provider. These include supplements. What foods should I limit? Limit your intake of the following foods, or eat them as told by your dietitian. Vegetables Spinach. Rhubarb. Beets. Canned vegetables. Pickles. Olives. Baked potatoes with skin. Grains Wheat bran. Baked goods. Salted crackers. Cereals high in sugar. Meats and other proteins Nuts. Nut butters. Large portions of meat, poultry, or fish. Salted, precooked, or cured meats, such as sausages, meat loaves, and hot dogs. Dairy Cheese. Beverages Regular soft drinks. Regular vegetable juice. Seasonings and condiments Seasoning blends with salt. Salad dressings. Soy sauce. Ketchup. Barbecue sauce. Other foods Canned soups. Canned pasta sauce. Casseroles. Pizza. Lasagna. Frozen meals. Potato chips. French fries. The items listed above may not be a complete list of foods and beverages you should limit. Contact a dietitian for more information. What foods should I avoid? Talk to your dietitian about specific foods you should avoid based on the type of kidney stones you have and your overall health. Fruits Grapefruit. The item listed above may not be a complete list of foods and beverages you should  avoid. Contact a dietitian for more information. Summary Kidney stones are deposits of minerals and salts that form inside your kidneys. You can lower your risk of kidney stones by making changes to your diet. The most important thing you can do is drink enough fluid. Drink enough fluid to keep your urine pale yellow. Talk to your dietitian about how much calcium you should have each day, and eat less salt and animal protein as told by your dietitian. This information is not intended to replace advice given to you by your health care provider. Make sure you discuss any questions you have with your health care provider. Document Revised: 05/01/2021 Document Reviewed: 05/01/2021 Elsevier Patient Education  2023 [employer].  

## 2022-01-05 DIAGNOSIS — J449 Chronic obstructive pulmonary disease, unspecified: Secondary | ICD-10-CM | POA: Diagnosis not present

## 2022-01-05 DIAGNOSIS — N183 Chronic kidney disease, stage 3 unspecified: Secondary | ICD-10-CM | POA: Diagnosis not present

## 2022-01-05 DIAGNOSIS — E1165 Type 2 diabetes mellitus with hyperglycemia: Secondary | ICD-10-CM | POA: Diagnosis not present

## 2022-01-09 DIAGNOSIS — S91331A Puncture wound without foreign body, right foot, initial encounter: Secondary | ICD-10-CM | POA: Diagnosis not present

## 2022-01-12 DIAGNOSIS — G4733 Obstructive sleep apnea (adult) (pediatric): Secondary | ICD-10-CM | POA: Diagnosis not present

## 2022-01-18 ENCOUNTER — Ambulatory Visit (INDEPENDENT_AMBULATORY_CARE_PROVIDER_SITE_OTHER): Payer: Medicare Other | Admitting: Nurse Practitioner

## 2022-01-18 ENCOUNTER — Encounter: Payer: Self-pay | Admitting: Nurse Practitioner

## 2022-01-18 VITALS — BP 160/83 | HR 67 | Ht 71.0 in | Wt 336.0 lb

## 2022-01-18 DIAGNOSIS — I10 Essential (primary) hypertension: Secondary | ICD-10-CM | POA: Diagnosis not present

## 2022-01-18 DIAGNOSIS — E1122 Type 2 diabetes mellitus with diabetic chronic kidney disease: Secondary | ICD-10-CM

## 2022-01-18 DIAGNOSIS — E782 Mixed hyperlipidemia: Secondary | ICD-10-CM | POA: Diagnosis not present

## 2022-01-18 DIAGNOSIS — N1832 Chronic kidney disease, stage 3b: Secondary | ICD-10-CM | POA: Diagnosis not present

## 2022-01-18 LAB — POCT GLYCOSYLATED HEMOGLOBIN (HGB A1C): Hemoglobin A1C: 7.1 % — AB (ref 4.0–5.6)

## 2022-01-18 NOTE — Patient Instructions (Signed)
Diabetes Mellitus and Foot Care Foot care is an important part of your health, especially when you have diabetes. Diabetes may cause you to have problems because of poor blood flow (circulation) to your feet and legs, which can cause your skin to: Become thinner and drier. Break more easily. Heal more slowly. Peel and crack. You may also have nerve damage (neuropathy) in your legs and feet, causing decreased feeling in them. This means that you may not notice minor injuries to your feet that could lead to more serious problems. Noticing and addressing any potential problems early is the best way to prevent future foot problems. How to care for your feet Foot hygiene  Wash your feet daily with warm water and mild soap. Do not use hot water. Then, pat your feet and the areas between your toes until they are completely dry. Do not soak your feet as this can dry your skin. Trim your toenails straight across. Do not dig under them or around the cuticle. File the edges of your nails with an emery board or nail file. Apply a moisturizing lotion or petroleum jelly to the skin on your feet and to dry, brittle toenails. Use lotion that does not contain alcohol and is unscented. Do not apply lotion between your toes. Shoes and socks Wear clean socks or stockings every day. Make sure they are not too tight. Do not wear knee-high stockings since they may decrease blood flow to your legs. Wear shoes that fit properly and have enough cushioning. Always look in your shoes before you put them on to be sure there are no objects inside. To break in new shoes, wear them for just a few hours a day. This prevents injuries on your feet. Wounds, scrapes, corns, and calluses  Check your feet daily for blisters, cuts, bruises, sores, and redness. If you cannot see the bottom of your feet, use a mirror or ask someone for help. Do not cut corns or calluses or try to remove them with medicine. If you find a minor scrape,  cut, or break in the skin on your feet, keep it and the skin around it clean and dry. You may clean these areas with mild soap and water. Do not clean the area with peroxide, alcohol, or iodine. If you have a wound, scrape, corn, or callus on your foot, look at it several times a day to make sure it is healing and not infected. Check for: Redness, swelling, or pain. Fluid or blood. Warmth. Pus or a bad smell. General tips Do not cross your legs. This may decrease blood flow to your feet. Do not use heating pads or hot water bottles on your feet. They may burn your skin. If you have lost feeling in your feet or legs, you may not know this is happening until it is too late. Protect your feet from hot and cold by wearing shoes, such as at the beach or on hot pavement. Schedule a complete foot exam at least once a year (annually) or more often if you have foot problems. Report any cuts, sores, or bruises to your health care provider immediately. Where to find more information American Diabetes Association: www.diabetes.org Association of Diabetes Care & Education Specialists: www.diabeteseducator.org Contact a health care provider if: You have a medical condition that increases your risk of infection and you have any cuts, sores, or bruises on your feet. You have an injury that is not healing. You have redness on your legs or feet. You   feel burning or tingling in your legs or feet. You have pain or cramps in your legs and feet. Your legs or feet are numb. Your feet always feel cold. You have pain around any toenails. Get help right away if: You have a wound, scrape, corn, or callus on your foot and: You have pain, swelling, or redness that gets worse. You have fluid or blood coming from the wound, scrape, corn, or callus. Your wound, scrape, corn, or callus feels warm to the touch. You have pus or a bad smell coming from the wound, scrape, corn, or callus. You have a fever. You have a red  line going up your leg. Summary Check your feet every day for blisters, cuts, bruises, sores, and redness. Apply a moisturizing lotion or petroleum jelly to the skin on your feet and to dry, brittle toenails. Wear shoes that fit properly and have enough cushioning. If you have foot problems, report any cuts, sores, or bruises to your health care provider immediately. Schedule a complete foot exam at least once a year (annually) or more often if you have foot problems. This information is not intended to replace advice given to you by your health care provider. Make sure you discuss any questions you have with your health care provider. Document Revised: 03/10/2020 Document Reviewed: 03/10/2020 Elsevier Patient Education  2023 [employer]  

## 2022-01-18 NOTE — Progress Notes (Signed)
Endocrinology Follow Up Note       01/18/2022, 4:14 PM   Subjective:    Patient ID: Adam Grant, male    DOB: Oct 13, 1955.  Adam Grant is being seen in follow up after being seen in consultation for management of currently uncontrolled symptomatic diabetes requested by  Bonnita Hollow, MD.   Past Medical History:  Diagnosis Date   Diabetes mellitus without complication ([place])    Hypertension    Obstructive sleep apnea     Past Surgical History:  Procedure Laterality Date   EYE SURGERY Bilateral    1978, 2001   SPINE SURGERY     Spine Cancer 2019    Social History   Socioeconomic History   Marital status: Divorced    Spouse name: Not on file   Number of children: 3   Years of education: Not on file   Highest education level: Not on file  Occupational History   Occupation: elect. tech  Tobacco Use   Smoking status: Never    Passive exposure: Past   Smokeless tobacco: Never  Vaping Use   Vaping Use: Never used  Substance and Sexual Activity   Alcohol use: Yes    Comment: ocass.    Drug use: No   Sexual activity: Not on file  Other Topics Concern   Not on file  Social History Narrative   Not on file   Social Determinants of Health   Financial Resource Strain: Not on file  Food Insecurity: Not on file  Transportation Needs: Not on file  Physical Activity: Not on file  Stress: Not on file  Social Connections: Not on file    Family History  Problem Relation Age of Onset   Cancer Mother    Hypertension Mother    Hyperlipidemia Mother    Hypertension Father    Cancer Father    Heart attack Father    Heart failure Father     Outpatient Encounter Medications as of 01/18/2022  Medication Sig   ACCU-CHEK GUIDE test strip Use to monitor glucose 4 times daily, before meals and before bed.   Accu-Chek Softclix Lancets lancets Use as instructed to monitor glucose 4 times daily    acetaminophen (TYLENOL) 325 MG tablet Take by mouth.   amLODipine (NORVASC) 10 MG tablet    aspirin EC 81 MG tablet Take by mouth.   atorvastatin (LIPITOR) 40 MG tablet Take by mouth.   Cholecalciferol (VITAMIN D3 PO) Take by mouth.   glipiZIDE (GLUCOTROL XL) 10 MG 24 hr tablet Take 10 mg by mouth daily.   hydrochlorothiazide (HYDRODIURIL) 25 MG tablet Take by mouth.   meloxicam (MOBIC) 15 MG tablet Take 15 mg by mouth daily.   Multiple Vitamins-Minerals (COMPLETE) TABS Take by mouth.   oxycodone (OXY-IR) 5 MG capsule Take 5 mg by mouth as needed.   sulfamethoxazole-trimethoprim (BACTRIM DS) 800-160 MG tablet Take 1 tablet by mouth 2 (two) times daily.   tamsulosin (FLOMAX) 0.4 MG CAPS capsule Take by mouth.   No facility-administered encounter medications on file as of 01/18/2022.    ALLERGIES: Allergies  Allergen Reactions   Farxiga [Dapagliflozin] Rash   Losartan Swelling, Palpitations and Rash  Penicillins Itching and Rash   Tetracyclines & Related Rash    VACCINATION STATUS: Immunization History  Administered Date(s) Administered   PFIZER Comirnaty(Gray Top)Covid-19 Tri-Sucrose Vaccine 10/24/2019, 11/15/2019, 07/20/2020   PFIZER(Purple Top)SARS-COV-2 Vaccination 10/24/2019    Diabetes He presents for his follow-up diabetic visit. He has type 2 diabetes mellitus. Onset time: Diagnosed at approx age of 66. His disease course has been improving. There are no hypoglycemic associated symptoms. Associated symptoms include fatigue. Pertinent negatives for diabetes include no blurred vision, no foot ulcerations, no polydipsia and no polyuria. There are no hypoglycemic complications. Symptoms are improving. Diabetic complications include nephropathy and retinopathy. Risk factors for coronary artery disease include diabetes mellitus, dyslipidemia, family history, male sex, obesity, hypertension and sedentary lifestyle. Current diabetic treatment includes oral agent (monotherapy). He is  compliant with treatment most of the time. His weight is decreasing steadily. He is following a generally unhealthy diet. When asked about meal planning, he reported none. He has had a previous visit with a dietitian (has seen dietician in distant past). His home blood glucose trend is decreasing steadily. His breakfast blood glucose range is generally 140-180 mg/dl. His bedtime blood glucose range is generally >200 mg/dl. (He presents today with his logs showing slightly increased glycemic profile over the last several weeks.  His previsit A1c on 12/06/21 was 6.7% improved, insisted we repeat today was 7.1%.  He brought in food log showing soda, both diet and regular, and processed meats and large quantities for meals at times.  He denies any hypoglycemia.) An ACE inhibitor/angiotensin II receptor blocker is not being taken. He does not see a podiatrist.Eye exam is current.  Hyperlipidemia This is a chronic problem. The current episode started more than 1 year ago. Exacerbating diseases include chronic renal disease, diabetes and obesity. Factors aggravating his hyperlipidemia include thiazides and fatty foods. Current antihyperlipidemic treatment includes statins. Compliance problems include adherence to diet and adherence to exercise.  Risk factors for coronary artery disease include diabetes mellitus, dyslipidemia, family history, male sex, obesity, hypertension and a sedentary lifestyle.  Hypertension This is a chronic problem. The current episode started more than 1 year ago. The problem is unchanged. The problem is uncontrolled. Pertinent negatives include no blurred vision. There are no associated agents to hypertension. Risk factors for coronary artery disease include dyslipidemia, diabetes mellitus, family history, obesity, male gender and sedentary lifestyle. Past treatments include diuretics and calcium channel blockers. The current treatment provides mild improvement. Compliance problems include diet  and exercise.  Hypertensive end-organ damage includes kidney disease and retinopathy. Identifiable causes of hypertension include chronic renal disease and sleep apnea.    Review of systems  Constitutional: + steadily decreasing weight,  current Body mass index is 46.86 kg/m. , no fatigue, no subjective hyperthermia, no subjective hypothermia Eyes: no blurry vision, no xerophthalmia ENT: no sore throat, no nodules palpated in throat, no dysphagia/odynophagia, no hoarseness Cardiovascular: no chest pain, no shortness of breath, no palpitations, no leg swelling Respiratory: no cough, no shortness of breath Gastrointestinal: no nausea/vomiting/diarrhea Musculoskeletal: + generalized muscle aches- walks with cane Skin: no rashes, no hyperemia Neurological: no tremors, no numbness, no tingling, no dizziness Psychiatric: no depression, no anxiety  Objective:     BP (!) 160/83   Pulse 67   Ht '5\' 11"'$  (1.803 m)   Wt (!) 336 lb (152.4 kg)   BMI 46.86 kg/m   Wt Readings from Last 3 Encounters:  01/18/22 (!) 336 lb (152.4 kg)  11/29/21 (!) 344 lb (156  kg)  10/18/21 (!) 352 lb (159.7 kg)     BP Readings from Last 3 Encounters:  01/18/22 (!) 160/83  12/27/21 (!) 146/79  11/29/21 (!) 177/79     Physical Exam- Limited  Constitutional:  Body mass index is 46.86 kg/m. , not in acute distress, distracted state of mind with frequent interruptions Eyes:  EOMI, no exophthalmos Neck: Supple Musculoskeletal: no gross deformities, strength intact in all four extremities, no gross restriction of joint movements, walks with cane Skin:  no rashes, no hyperemia Neurological: no tremor with outstretched hands   Diabetic Foot Exam - Simple   Simple Foot Form Diabetic Foot exam was performed with the following findings: Yes 01/18/2022  4:11 PM  Visual Inspection See comments: Yes Sensation Testing Intact to touch and monofilament testing bilaterally: Yes Pulse Check Posterior Tibialis and  Dorsalis pulse intact bilaterally: Yes Comments Dry flaky skin to bilateral feet with mild onychomycosis.      CMP ( most recent) CMP     Component Value Date/Time   BUN 23 (A) 11/09/2020 0000   CREATININE 1.8 (A) 11/09/2020 0000   ALKPHOS 131 (A) 11/09/2020 0000   GFRNONAA 40 11/09/2020 0000     Diabetic Labs (most recent): Lab Results  Component Value Date   HGBA1C 7.6 (A) 10/18/2021   HGBA1C 12.6 05/25/2021   HGBA1C 6.7 11/09/2020     Lipid Panel ( most recent) Lipid Panel  No results found for: CHOL, TRIG, HDL, CHOLHDL, VLDL, LDLCALC, LDLDIRECT, LABVLDL    No results found for: TSH, FREET4         Assessment & Plan:   1) Type 2 diabetes mellitus with stage 3b chronic kidney disease, without long-term current use of insulin ([place])  He presents today with his logs showing slightly increased glycemic profile over the last several weeks.  His previsit A1c on 12/06/21 was 6.7% improved, insisted we repeat today was 7.1%.  He brought in food log showing soda, both diet and regular, and processed meats and large quantities for meals at times.  He denies any hypoglycemia.   - MUSTAPHA COLSON has currently uncontrolled symptomatic type 2 DM since 66 years of age.   -Recent labs reviewed.  - I had a long discussion with him about the progressive nature of diabetes and the pathology behind its complications. -his diabetes is complicated by nonhealing diabetic foot ulcer, PAD, CKD, retinopathy and he remains at a high risk for more acute and chronic complications which include CAD, CVA, CKD, retinopathy, and neuropathy. These are all discussed in detail with him.  - Nutritional counseling repeated at each appointment due to patients tendency to fall back in to old habits.  - The patient admits there is a room for improvement in their diet and drink choices. -  Suggestion is made for the patient to avoid simple carbohydrates from their diet including Cakes, Sweet Desserts /  Pastries, Ice Cream, Soda (diet and regular), Sweet Tea, Candies, Chips, Cookies, Sweet Pastries, Store Bought Juices, Alcohol in Excess of 1-2 drinks a day, Artificial Sweeteners, Coffee Creamer, and "Sugar-free" Products. This will help patient to have stable blood glucose profile and potentially avoid unintended weight gain.   - I encouraged the patient to switch to unprocessed or minimally processed complex starch and increased protein intake (animal or plant source), fruits, and vegetables.   - Patient is advised to stick to a routine mealtimes to eat 3 meals a day and avoid unnecessary snacks (to snack only to correct  hypoglycemia).  - I have approached him with the following individualized plan to manage his diabetes and patient agrees:   - He is advised to continue Glipizide 10 mg ER daily with breakfast , therapeutically suitable for patient .  He will benefit most from avoiding sugary beverages and working on meal quantity.  -He is encouraged to continue monitoring blood glucose twice daily, at breakfast and before bed, and to call the clinic if he has readings less than 70 or above 300 for 3 tests in a row.  - Adjustment parameters are given to him for hypo and hyperglycemia in writing.  - he is not a candidate for Metformin due to concurrent renal insufficiency.  - he did not tolerate Metformin, Rybelsus, Tradjenta, or Farxiga in the past.  - Specific targets for  A1c; LDL, HDL, and Triglycerides were discussed with the patient.  2) Blood Pressure /Hypertension:  his blood pressure is controlled to target.   he is advised to continue his current medications including Norvasc 10 mg p.o. daily with breakfast, and HCTZ 25 mg po daily.  3) Lipids/Hyperlipidemia:    His recent lipid panel from 12/06/21 shows controlled LDL of 71 and elevated triglycerides of 160.  he is advised to continue Lipitor 40 mg daily at bedtime.  Side effects and precautions discussed with him.  4)   Weight/Diet:  his Body mass index is 46.86 kg/m.  -  clearly complicating his diabetes care.   he is a candidate for weight loss. I discussed with him the fact that loss of 5 - 10% of his  current body weight will have the most impact on his diabetes management.  Exercise, and detailed carbohydrates information provided  -  detailed on discharge instructions.  5) Chronic Care/Health Maintenance: -he is not on ACEI/ARB and is on Statin medications and is encouraged to initiate and continue to follow up with Ophthalmology, Dentist, Podiatrist at least yearly or according to recommendations, and advised to stay away from smoking. I have recommended yearly flu vaccine and pneumonia vaccine at least every 5 years; moderate intensity exercise for up to 150 minutes weekly; and sleep for at least 7 hours a day.  - he is advised to maintain close follow up with Bonnita Hollow, MD for primary care needs, as well as his other providers for optimal and coordinated care.      I spent 41 minutes in the care of the patient today including review of labs from [place], Lipids, Thyroid Function, Hematology (current and previous including abstractions from other facilities); face-to-face time discussing  his blood glucose readings/logs, discussing hypoglycemia and hyperglycemia episodes and symptoms, medications doses, his options of short and long term treatment based on the latest standards of care / guidelines;  discussion about incorporating lifestyle medicine;  and documenting the encounter.    Please refer to Patient Instructions for Blood Glucose Monitoring and Insulin/Medications Dosing Guide"  in media tab for additional information. Please  also refer to " Patient Self Inventory" in the Media  tab for reviewed elements of pertinent patient history.  Graylin Shiver participated in the discussions, expressed understanding, and voiced agreement with the above plans.  All questions were answered to his  satisfaction. he is encouraged to contact clinic should he have any questions or concerns prior to his return visit.     Follow up plan: - Return in about 4 months (around 05/21/2022) for Diabetes F/U with A1c in office, No previsit labs, Bring meter and logs.  Rayetta Pigg, [facility] [address] [place], [place] [address] Phone: [phone] Fax: [phone]  01/18/2022, 4:14 PM

## 2022-02-05 DIAGNOSIS — E7849 Other hyperlipidemia: Secondary | ICD-10-CM | POA: Diagnosis not present

## 2022-02-05 DIAGNOSIS — R809 Proteinuria, unspecified: Secondary | ICD-10-CM | POA: Diagnosis not present

## 2022-02-05 DIAGNOSIS — E1165 Type 2 diabetes mellitus with hyperglycemia: Secondary | ICD-10-CM | POA: Diagnosis not present

## 2022-02-05 DIAGNOSIS — I878 Other specified disorders of veins: Secondary | ICD-10-CM | POA: Diagnosis not present

## 2022-02-05 DIAGNOSIS — I10 Essential (primary) hypertension: Secondary | ICD-10-CM | POA: Diagnosis not present

## 2022-02-05 DIAGNOSIS — Z1389 Encounter for screening for other disorder: Secondary | ICD-10-CM | POA: Diagnosis not present

## 2022-02-12 DIAGNOSIS — G4733 Obstructive sleep apnea (adult) (pediatric): Secondary | ICD-10-CM | POA: Diagnosis not present

## 2022-03-01 DIAGNOSIS — N200 Calculus of kidney: Secondary | ICD-10-CM | POA: Diagnosis not present

## 2022-03-02 DIAGNOSIS — N183 Chronic kidney disease, stage 3 unspecified: Secondary | ICD-10-CM | POA: Diagnosis not present

## 2022-03-02 DIAGNOSIS — J449 Chronic obstructive pulmonary disease, unspecified: Secondary | ICD-10-CM | POA: Diagnosis not present

## 2022-03-07 DIAGNOSIS — R809 Proteinuria, unspecified: Secondary | ICD-10-CM | POA: Diagnosis not present

## 2022-03-07 DIAGNOSIS — I10 Essential (primary) hypertension: Secondary | ICD-10-CM | POA: Diagnosis not present

## 2022-03-07 DIAGNOSIS — E1165 Type 2 diabetes mellitus with hyperglycemia: Secondary | ICD-10-CM | POA: Diagnosis not present

## 2022-03-07 DIAGNOSIS — I878 Other specified disorders of veins: Secondary | ICD-10-CM | POA: Diagnosis not present

## 2022-03-14 DIAGNOSIS — K649 Unspecified hemorrhoids: Secondary | ICD-10-CM | POA: Diagnosis not present

## 2022-03-14 DIAGNOSIS — R809 Proteinuria, unspecified: Secondary | ICD-10-CM | POA: Diagnosis not present

## 2022-03-14 DIAGNOSIS — G4733 Obstructive sleep apnea (adult) (pediatric): Secondary | ICD-10-CM | POA: Diagnosis not present

## 2022-03-14 DIAGNOSIS — I10 Essential (primary) hypertension: Secondary | ICD-10-CM | POA: Diagnosis not present

## 2022-03-14 DIAGNOSIS — E1165 Type 2 diabetes mellitus with hyperglycemia: Secondary | ICD-10-CM | POA: Diagnosis not present

## 2022-03-14 DIAGNOSIS — I878 Other specified disorders of veins: Secondary | ICD-10-CM | POA: Diagnosis not present

## 2022-04-02 DIAGNOSIS — N183 Chronic kidney disease, stage 3 unspecified: Secondary | ICD-10-CM | POA: Diagnosis not present

## 2022-04-02 DIAGNOSIS — J449 Chronic obstructive pulmonary disease, unspecified: Secondary | ICD-10-CM | POA: Diagnosis not present

## 2022-04-02 DIAGNOSIS — E1165 Type 2 diabetes mellitus with hyperglycemia: Secondary | ICD-10-CM | POA: Diagnosis not present

## 2022-04-09 DIAGNOSIS — K649 Unspecified hemorrhoids: Secondary | ICD-10-CM | POA: Diagnosis not present

## 2022-04-09 DIAGNOSIS — I878 Other specified disorders of veins: Secondary | ICD-10-CM | POA: Diagnosis not present

## 2022-04-09 DIAGNOSIS — E1165 Type 2 diabetes mellitus with hyperglycemia: Secondary | ICD-10-CM | POA: Diagnosis not present

## 2022-04-09 DIAGNOSIS — R809 Proteinuria, unspecified: Secondary | ICD-10-CM | POA: Diagnosis not present

## 2022-04-09 DIAGNOSIS — I10 Essential (primary) hypertension: Secondary | ICD-10-CM | POA: Diagnosis not present

## 2022-04-14 DIAGNOSIS — G4733 Obstructive sleep apnea (adult) (pediatric): Secondary | ICD-10-CM | POA: Diagnosis not present

## 2022-04-19 DIAGNOSIS — G4733 Obstructive sleep apnea (adult) (pediatric): Secondary | ICD-10-CM | POA: Diagnosis not present

## 2022-05-15 DIAGNOSIS — G4733 Obstructive sleep apnea (adult) (pediatric): Secondary | ICD-10-CM | POA: Diagnosis not present

## 2022-05-21 ENCOUNTER — Ambulatory Visit: Payer: Medicare Other | Admitting: Nurse Practitioner

## 2022-05-21 ENCOUNTER — Encounter: Payer: Self-pay | Admitting: Nurse Practitioner

## 2022-05-21 VITALS — BP 114/68 | HR 72 | Ht 71.0 in | Wt 324.8 lb

## 2022-05-21 DIAGNOSIS — E1122 Type 2 diabetes mellitus with diabetic chronic kidney disease: Secondary | ICD-10-CM

## 2022-05-21 DIAGNOSIS — N1832 Chronic kidney disease, stage 3b: Secondary | ICD-10-CM | POA: Diagnosis not present

## 2022-05-21 DIAGNOSIS — I10 Essential (primary) hypertension: Secondary | ICD-10-CM

## 2022-05-21 DIAGNOSIS — E782 Mixed hyperlipidemia: Secondary | ICD-10-CM | POA: Diagnosis not present

## 2022-05-21 LAB — POCT GLYCOSYLATED HEMOGLOBIN (HGB A1C): Hemoglobin A1C: 6 % — AB (ref 4.0–5.6)

## 2022-05-21 NOTE — Progress Notes (Signed)
Endocrinology Follow Up Note       05/21/2022, 1:49 PM   Subjective:    Patient ID: Adam Grant, male    DOB: 01-May-1956.  Adam Grant is being seen in follow up after being seen in consultation for management of currently uncontrolled symptomatic diabetes requested by  Bonnita Hollow, MD.   Past Medical History:  Diagnosis Date   Diabetes mellitus without complication ([place])    Hypertension    Obstructive sleep apnea     Past Surgical History:  Procedure Laterality Date   EYE SURGERY Bilateral    1978, 2001   SPINE SURGERY     Spine Cancer 2019    Social History   Socioeconomic History   Marital status: Divorced    Spouse name: Not on file   Number of children: 3   Years of education: Not on file   Highest education level: Not on file  Occupational History   Occupation: elect. tech  Tobacco Use   Smoking status: Never    Passive exposure: Past   Smokeless tobacco: Never  Vaping Use   Vaping Use: Never used  Substance and Sexual Activity   Alcohol use: Yes    Comment: ocass.    Drug use: No   Sexual activity: Not on file  Other Topics Concern   Not on file  Social History Narrative   Not on file   Social Determinants of Health   Financial Resource Strain: Not on file  Food Insecurity: Not on file  Transportation Needs: Not on file  Physical Activity: Not on file  Stress: Not on file  Social Connections: Not on file    Family History  Problem Relation Age of Onset   Cancer Mother    Hypertension Mother    Hyperlipidemia Mother    Hypertension Father    Cancer Father    Heart attack Father    Heart failure Father     Outpatient Encounter Medications as of 05/21/2022  Medication Sig   ACCU-CHEK GUIDE test strip Use to monitor glucose 4 times daily, before meals and before bed.   Accu-Chek Softclix Lancets lancets Use as instructed to monitor glucose 4 times daily    acetaminophen (TYLENOL) 325 MG tablet Take by mouth.   amLODipine (NORVASC) 10 MG tablet    aspirin EC 81 MG tablet Take by mouth.   atorvastatin (LIPITOR) 40 MG tablet Take by mouth.   Cholecalciferol (VITAMIN D-3 PO) Take by mouth.   Cholecalciferol (VITAMIN D3 PO) Take by mouth.   glipiZIDE (GLUCOTROL XL) 10 MG 24 hr tablet Take 10 mg by mouth daily.   hydrochlorothiazide (HYDRODIURIL) 25 MG tablet Take by mouth.   labetalol (NORMODYNE) 100 MG tablet Take 200 mg by mouth 2 (two) times daily.   Multiple Vitamins-Minerals (COMPLETE) TABS Take by mouth.   oxycodone (OXY-IR) 5 MG capsule Take 5 mg by mouth as needed.   Semaglutide,0.25 or 0.'5MG'$ /DOS, (OZEMPIC, 0.25 OR 0.5 MG/DOSE,) 2 MG/3ML SOPN Inject 0.5 mg into the skin once a week.   sulfamethoxazole-trimethoprim (BACTRIM DS) 800-160 MG tablet Take 1 tablet by mouth 2 (two) times daily.   tamsulosin (FLOMAX) 0.4 MG CAPS capsule  Take by mouth.   meloxicam (MOBIC) 15 MG tablet Take 15 mg by mouth daily. (Patient not taking: Reported on 05/21/2022)   No facility-administered encounter medications on file as of 05/21/2022.    ALLERGIES: Allergies  Allergen Reactions   Farxiga [Dapagliflozin] Rash   Losartan Swelling, Palpitations and Rash   Penicillins Itching and Rash   Tetracyclines & Related Rash    VACCINATION STATUS: Immunization History  Administered Date(s) Administered   PFIZER Comirnaty(Gray Top)Covid-19 Tri-Sucrose Vaccine 10/24/2019, 11/15/2019, 07/20/2020   PFIZER(Purple Top)SARS-COV-2 Vaccination 10/24/2019    Diabetes He presents for his follow-up diabetic visit. He has type 2 diabetes mellitus. Onset time: Diagnosed at approx age of 23. His disease course has been improving. There are no hypoglycemic associated symptoms. Associated symptoms include fatigue. Pertinent negatives for diabetes include no blurred vision, no foot ulcerations, no polydipsia and no polyuria. There are no hypoglycemic complications. Symptoms are  improving. Diabetic complications include nephropathy and retinopathy. Risk factors for coronary artery disease include diabetes mellitus, dyslipidemia, family history, male sex, obesity, hypertension and sedentary lifestyle. Current diabetic treatment includes oral agent (monotherapy). He is compliant with treatment most of the time. His weight is decreasing steadily. He is following a generally unhealthy diet. When asked about meal planning, he reported none. He has had a previous visit with a dietitian (has seen dietician in distant past). His home blood glucose trend is decreasing steadily. His breakfast blood glucose range is generally 90-110 mg/dl. His bedtime blood glucose range is generally 140-180 mg/dl. (He presents today with his logs showing at target glycemic profile overall.  His POCT A1c today is 6%.  He notes that his PCP started him on Ozempic and aside from some diarrhea with initial start and dosage change, he has tolerated it ok.   He also notes they increased his Glipizide to 10 mg XL daily as well.  He denies any significant hypoglycemia but did verbalize some symptoms that could certainly have been mild hypoglycemia.) An ACE inhibitor/angiotensin II receptor blocker is not being taken. He does not see a podiatrist.Eye exam is current.  Hyperlipidemia This is a chronic problem. The current episode started more than 1 year ago. Exacerbating diseases include chronic renal disease, diabetes and obesity. Factors aggravating his hyperlipidemia include thiazides and fatty foods. Current antihyperlipidemic treatment includes statins. Compliance problems include adherence to diet and adherence to exercise.  Risk factors for coronary artery disease include diabetes mellitus, dyslipidemia, family history, male sex, obesity, hypertension and a sedentary lifestyle.  Hypertension This is a chronic problem. The current episode started more than 1 year ago. The problem is unchanged. The problem is  uncontrolled. Pertinent negatives include no blurred vision. There are no associated agents to hypertension. Risk factors for coronary artery disease include dyslipidemia, diabetes mellitus, family history, obesity, male gender and sedentary lifestyle. Past treatments include diuretics and calcium channel blockers. The current treatment provides mild improvement. Compliance problems include diet and exercise.  Hypertensive end-organ damage includes kidney disease and retinopathy. Identifiable causes of hypertension include chronic renal disease and sleep apnea.     Review of systems  Constitutional: + steadily decreasing weight,  current Body mass index is 45.3 kg/m. , no fatigue, no subjective hyperthermia, no subjective hypothermia Eyes: no blurry vision, no xerophthalmia ENT: no sore throat, no nodules palpated in throat, no dysphagia/odynophagia, no hoarseness Cardiovascular: no chest pain, no shortness of breath, no palpitations, no leg swelling Respiratory: no cough, no shortness of breath Gastrointestinal: no nausea/vomiting/diarrhea Musculoskeletal: + generalized muscle  aches- walks with cane Skin: no rashes, no hyperemia Neurological: no tremors, no numbness, no tingling, no dizziness Psychiatric: no depression, no anxiety  Objective:     BP 114/68 (BP Location: Left Arm, Patient Position: Sitting, Cuff Size: Large)   Pulse 72   Ht '5\' 11"'$  (1.803 m)   Wt (!) 324 lb 12.8 oz (147.3 kg)   BMI 45.30 kg/m   Wt Readings from Last 3 Encounters:  05/21/22 (!) 324 lb 12.8 oz (147.3 kg)  01/18/22 (!) 336 lb (152.4 kg)  11/29/21 (!) 344 lb (156 kg)     BP Readings from Last 3 Encounters:  05/21/22 114/68  01/18/22 (!) 160/83  12/27/21 (!) 146/79     Physical Exam- Limited  Constitutional:  Body mass index is 45.3 kg/m. , not in acute distress, distracted state of mind with frequent interruptions Eyes:  EOMI, no exophthalmos Neck: Supple Musculoskeletal: no gross deformities,  strength intact in all four extremities, no gross restriction of joint movements, walks with cane Skin:  no rashes, no hyperemia Neurological: no tremor with outstretched hands   Diabetic Foot Exam - Simple   No data filed     CMP ( most recent) CMP     Component Value Date/Time   BUN 23 (A) 11/09/2020 0000   CREATININE 1.8 (A) 11/09/2020 0000   ALKPHOS 131 (A) 11/09/2020 0000   GFRNONAA 40 11/09/2020 0000     Diabetic Labs (most recent): Lab Results  Component Value Date   HGBA1C 6.0 (A) 05/21/2022   HGBA1C 7.1 (A) 01/18/2022   HGBA1C 7.6 (A) 10/18/2021     Lipid Panel ( most recent) Lipid Panel  No results found for: "CHOL", "TRIG", "HDL", "CHOLHDL", "VLDL", "LDLCALC", "LDLDIRECT", "LABVLDL"    No results found for: "TSH", "FREET4"         Assessment & Plan:   1) Type 2 diabetes mellitus with stage 3b chronic kidney disease, without long-term current use of insulin ([place])  He presents today with his logs showing at target glycemic profile overall.  His POCT A1c today is 6%.  He notes that his PCP started him on Ozempic and aside from some diarrhea with initial start and dosage change, he has tolerated it ok.   He also notes they increased his Glipizide to 10 mg XL daily as well.  He denies any significant hypoglycemia but did verbalize some symptoms that could certainly have been mild hypoglycemia.   - Adam Grant has currently uncontrolled symptomatic type 2 DM since 66 years of age.   -Recent labs reviewed.  - I had a long discussion with him about the progressive nature of diabetes and the pathology behind its complications. -his diabetes is complicated by nonhealing diabetic foot ulcer, PAD, CKD, retinopathy and he remains at a high risk for more acute and chronic complications which include CAD, CVA, CKD, retinopathy, and neuropathy. These are all discussed in detail with him.  - Nutritional counseling repeated at each appointment due to patients tendency  to fall back in to old habits.  - The patient admits there is a room for improvement in their diet and drink choices. -  Suggestion is made for the patient to avoid simple carbohydrates from their diet including Cakes, Sweet Desserts / Pastries, Ice Cream, Soda (diet and regular), Sweet Tea, Candies, Chips, Cookies, Sweet Pastries, Store Bought Juices, Alcohol in Excess of 1-2 drinks a day, Artificial Sweeteners, Coffee Creamer, and "Sugar-free" Products. This will help patient to have stable blood glucose  profile and potentially avoid unintended weight gain.   - I encouraged the patient to switch to unprocessed or minimally processed complex starch and increased protein intake (animal or plant source), fruits, and vegetables.   - Patient is advised to stick to a routine mealtimes to eat 3 meals a day and avoid unnecessary snacks (to snack only to correct hypoglycemia).  - I have approached him with the following individualized plan to manage his diabetes and patient agrees:   - He is advised to continue Glipizide 10 mg ER daily with breakfast , therapeutically suitable for patient .  He can continue Ozempic 0.5 mg SQ weekly as well since he is tolerating it well.  I anticipate needing to lower his Glipizide moving forward to avoid hypoglycemia.  -He is encouraged to continue monitoring blood glucose twice daily, at breakfast and before bed, and to call the clinic if he has readings less than 70 or above 300 for 3 tests in a row.  - Adjustment parameters are given to him for hypo and hyperglycemia in writing.  - he is not a candidate for Metformin due to concurrent renal insufficiency.  - he did not tolerate Metformin, Rybelsus, Tradjenta, or Farxiga in the past.  - Specific targets for  A1c; LDL, HDL, and Triglycerides were discussed with the patient.  2) Blood Pressure /Hypertension:  his blood pressure is controlled to target.   he is advised to continue his current medications including  Norvasc 10 mg p.o. daily with breakfast, and HCTZ 25 mg po daily.  3) Lipids/Hyperlipidemia:    His recent lipid panel from 12/06/21 shows controlled LDL of 71 and elevated triglycerides of 160.  he is advised to continue Lipitor 40 mg daily at bedtime.  Side effects and precautions discussed with him.  4)  Weight/Diet:  his Body mass index is 45.3 kg/m.  -  clearly complicating his diabetes care.   he is a candidate for weight loss. I discussed with him the fact that loss of 5 - 10% of his  current body weight will have the most impact on his diabetes management.  Exercise, and detailed carbohydrates information provided  -  detailed on discharge instructions.  5) Chronic Care/Health Maintenance: -he is not on ACEI/ARB and is on Statin medications and is encouraged to initiate and continue to follow up with Ophthalmology, Dentist, Podiatrist at least yearly or according to recommendations, and advised to stay away from smoking. I have recommended yearly flu vaccine and pneumonia vaccine at least every 5 years; moderate intensity exercise for up to 150 minutes weekly; and sleep for at least 7 hours a day.  - he is advised to maintain close follow up with Bonnita Hollow, MD for primary care needs, as well as his other providers for optimal and coordinated care.      I spent 53 minutes in the care of the patient today including review of labs from [place], Lipids, Thyroid Function, Hematology (current and previous including abstractions from other facilities); face-to-face time discussing  his blood glucose readings/logs, discussing hypoglycemia and hyperglycemia episodes and symptoms, medications doses, his options of short and long term treatment based on the latest standards of care / guidelines;  discussion about incorporating lifestyle medicine;  and documenting the encounter. Risk reduction counseling performed per USPSTF guidelines to reduce obesity and cardiovascular risk factors.     Please  refer to Patient Instructions for Blood Glucose Monitoring and Insulin/Medications Dosing Guide"  in media tab for additional information. Please  also refer to " Patient Self Inventory" in the Media  tab for reviewed elements of pertinent patient history.  Graylin Shiver participated in the discussions, expressed understanding, and voiced agreement with the above plans.  All questions were answered to his satisfaction. he is encouraged to contact clinic should he have any questions or concerns prior to his return visit.     Follow up plan: - Return in about 4 months (around 09/20/2022) for Diabetes F/U with A1c in office, No previsit labs, Bring meter and logs.    Rayetta Pigg, [facility] [address] [place], [place] [address] Phone: [phone] Fax: [phone]  05/21/2022, 1:49 PM

## 2022-05-30 DIAGNOSIS — M50322 Other cervical disc degeneration at C5-C6 level: Secondary | ICD-10-CM | POA: Diagnosis not present

## 2022-05-30 DIAGNOSIS — C720 Malignant neoplasm of spinal cord: Secondary | ICD-10-CM | POA: Diagnosis not present

## 2022-05-30 DIAGNOSIS — M47812 Spondylosis without myelopathy or radiculopathy, cervical region: Secondary | ICD-10-CM | POA: Diagnosis not present

## 2022-05-30 DIAGNOSIS — C412 Malignant neoplasm of vertebral column: Secondary | ICD-10-CM | POA: Diagnosis not present

## 2022-06-02 DIAGNOSIS — E1169 Type 2 diabetes mellitus with other specified complication: Secondary | ICD-10-CM | POA: Diagnosis not present

## 2022-06-02 DIAGNOSIS — I10 Essential (primary) hypertension: Secondary | ICD-10-CM | POA: Diagnosis not present

## 2022-06-07 DIAGNOSIS — E1165 Type 2 diabetes mellitus with hyperglycemia: Secondary | ICD-10-CM | POA: Diagnosis not present

## 2022-06-07 DIAGNOSIS — Z0000 Encounter for general adult medical examination without abnormal findings: Secondary | ICD-10-CM | POA: Diagnosis not present

## 2022-06-07 DIAGNOSIS — I10 Essential (primary) hypertension: Secondary | ICD-10-CM | POA: Diagnosis not present

## 2022-06-07 DIAGNOSIS — E785 Hyperlipidemia, unspecified: Secondary | ICD-10-CM | POA: Diagnosis not present

## 2022-06-07 DIAGNOSIS — N183 Chronic kidney disease, stage 3 unspecified: Secondary | ICD-10-CM | POA: Diagnosis not present

## 2022-06-14 DIAGNOSIS — R809 Proteinuria, unspecified: Secondary | ICD-10-CM | POA: Diagnosis not present

## 2022-06-14 DIAGNOSIS — I10 Essential (primary) hypertension: Secondary | ICD-10-CM | POA: Diagnosis not present

## 2022-06-14 DIAGNOSIS — I878 Other specified disorders of veins: Secondary | ICD-10-CM | POA: Diagnosis not present

## 2022-06-14 DIAGNOSIS — Z23 Encounter for immunization: Secondary | ICD-10-CM | POA: Diagnosis not present

## 2022-06-14 DIAGNOSIS — G4733 Obstructive sleep apnea (adult) (pediatric): Secondary | ICD-10-CM | POA: Diagnosis not present

## 2022-06-14 DIAGNOSIS — E1165 Type 2 diabetes mellitus with hyperglycemia: Secondary | ICD-10-CM | POA: Diagnosis not present

## 2022-06-14 DIAGNOSIS — K649 Unspecified hemorrhoids: Secondary | ICD-10-CM | POA: Diagnosis not present

## 2022-07-30 DIAGNOSIS — Z23 Encounter for immunization: Secondary | ICD-10-CM | POA: Diagnosis not present

## 2022-07-30 DIAGNOSIS — E1165 Type 2 diabetes mellitus with hyperglycemia: Secondary | ICD-10-CM | POA: Diagnosis not present

## 2022-07-30 DIAGNOSIS — I10 Essential (primary) hypertension: Secondary | ICD-10-CM | POA: Diagnosis not present

## 2022-07-30 DIAGNOSIS — E7849 Other hyperlipidemia: Secondary | ICD-10-CM | POA: Diagnosis not present

## 2022-07-30 DIAGNOSIS — K649 Unspecified hemorrhoids: Secondary | ICD-10-CM | POA: Diagnosis not present

## 2022-07-30 DIAGNOSIS — R809 Proteinuria, unspecified: Secondary | ICD-10-CM | POA: Diagnosis not present

## 2022-07-30 DIAGNOSIS — I878 Other specified disorders of veins: Secondary | ICD-10-CM | POA: Diagnosis not present

## 2022-08-13 DIAGNOSIS — R809 Proteinuria, unspecified: Secondary | ICD-10-CM | POA: Diagnosis not present

## 2022-08-13 DIAGNOSIS — I10 Essential (primary) hypertension: Secondary | ICD-10-CM | POA: Diagnosis not present

## 2022-08-13 DIAGNOSIS — K649 Unspecified hemorrhoids: Secondary | ICD-10-CM | POA: Diagnosis not present

## 2022-08-13 DIAGNOSIS — E1165 Type 2 diabetes mellitus with hyperglycemia: Secondary | ICD-10-CM | POA: Diagnosis not present

## 2022-08-13 DIAGNOSIS — I878 Other specified disorders of veins: Secondary | ICD-10-CM | POA: Diagnosis not present

## 2022-08-30 DIAGNOSIS — E1122 Type 2 diabetes mellitus with diabetic chronic kidney disease: Secondary | ICD-10-CM | POA: Diagnosis not present

## 2022-08-30 DIAGNOSIS — J019 Acute sinusitis, unspecified: Secondary | ICD-10-CM | POA: Diagnosis not present

## 2022-08-30 DIAGNOSIS — J449 Chronic obstructive pulmonary disease, unspecified: Secondary | ICD-10-CM | POA: Diagnosis not present

## 2022-08-30 DIAGNOSIS — N1832 Chronic kidney disease, stage 3b: Secondary | ICD-10-CM | POA: Diagnosis not present

## 2022-08-30 DIAGNOSIS — R030 Elevated blood-pressure reading, without diagnosis of hypertension: Secondary | ICD-10-CM | POA: Diagnosis not present

## 2022-09-11 DIAGNOSIS — I878 Other specified disorders of veins: Secondary | ICD-10-CM | POA: Diagnosis not present

## 2022-09-11 DIAGNOSIS — K649 Unspecified hemorrhoids: Secondary | ICD-10-CM | POA: Diagnosis not present

## 2022-09-11 DIAGNOSIS — R809 Proteinuria, unspecified: Secondary | ICD-10-CM | POA: Diagnosis not present

## 2022-09-11 DIAGNOSIS — I10 Essential (primary) hypertension: Secondary | ICD-10-CM | POA: Diagnosis not present

## 2022-09-11 DIAGNOSIS — R42 Dizziness and giddiness: Secondary | ICD-10-CM | POA: Diagnosis not present

## 2022-09-11 DIAGNOSIS — E1165 Type 2 diabetes mellitus with hyperglycemia: Secondary | ICD-10-CM | POA: Diagnosis not present

## 2022-09-24 ENCOUNTER — Encounter: Payer: Self-pay | Admitting: Nurse Practitioner

## 2022-09-24 ENCOUNTER — Ambulatory Visit: Payer: Medicare Other | Admitting: Nurse Practitioner

## 2022-09-24 VITALS — BP 133/65 | HR 59 | Ht 71.0 in | Wt 322.0 lb

## 2022-09-24 DIAGNOSIS — E782 Mixed hyperlipidemia: Secondary | ICD-10-CM | POA: Diagnosis not present

## 2022-09-24 DIAGNOSIS — E1122 Type 2 diabetes mellitus with diabetic chronic kidney disease: Secondary | ICD-10-CM

## 2022-09-24 DIAGNOSIS — I10 Essential (primary) hypertension: Secondary | ICD-10-CM

## 2022-09-24 DIAGNOSIS — N1832 Chronic kidney disease, stage 3b: Secondary | ICD-10-CM | POA: Diagnosis not present

## 2022-09-24 LAB — POCT GLYCOSYLATED HEMOGLOBIN (HGB A1C): Hemoglobin A1C: 5.9 % — AB (ref 4.0–5.6)

## 2022-09-24 MED ORDER — SEMAGLUTIDE (1 MG/DOSE) 4 MG/3ML ~~LOC~~ SOPN: 1.0000 mg | PEN_INJECTOR | SUBCUTANEOUS | 3 refills | Status: DC

## 2022-09-24 NOTE — Progress Notes (Signed)
Endocrinology Follow Up Note       09/24/2022, 4:31 PM   Subjective:    Patient ID: Adam Grant, male    DOB: 07-13-1956.  Adam Grant is being seen in follow up after being seen in consultation for management of currently uncontrolled symptomatic diabetes requested by  Bonnita Hollow, MD.   Past Medical History:  Diagnosis Date   Diabetes mellitus without complication ([place])    Hypertension    Obstructive sleep apnea     Past Surgical History:  Procedure Laterality Date   EYE SURGERY Bilateral    1978, 2001   SPINE SURGERY     Spine Cancer 2019    Social History   Socioeconomic History   Marital status: Divorced    Spouse name: Not on file   Number of children: 3   Years of education: Not on file   Highest education level: Not on file  Occupational History   Occupation: elect. tech  Tobacco Use   Smoking status: Never    Passive exposure: Past   Smokeless tobacco: Never  Vaping Use   Vaping Use: Never used  Substance and Sexual Activity   Alcohol use: Yes    Comment: ocass.    Drug use: No   Sexual activity: Not on file  Other Topics Concern   Not on file  Social History Narrative   Not on file   Social Determinants of Health   Financial Resource Strain: Not on file  Food Insecurity: Not on file  Transportation Needs: Not on file  Physical Activity: Not on file  Stress: Not on file  Social Connections: Not on file    Family History  Problem Relation Age of Onset   Cancer Mother    Hypertension Mother    Hyperlipidemia Mother    Hypertension Father    Cancer Father    Heart attack Father    Heart failure Father     Outpatient Encounter Medications as of 09/24/2022  Medication Sig   ACCU-CHEK GUIDE test strip Use to monitor glucose 4 times daily, before meals and before bed.   Accu-Chek Softclix Lancets lancets Use as instructed to monitor glucose 4 times daily    acetaminophen (TYLENOL) 325 MG tablet Take by mouth.   amLODipine (NORVASC) 10 MG tablet    aspirin EC 81 MG tablet Take by mouth.   atorvastatin (LIPITOR) 40 MG tablet Take by mouth.   Cholecalciferol (VITAMIN D-3 PO) Take 1,000 mg by mouth daily.   Cholecalciferol (VITAMIN D3 PO) Take by mouth.   labetalol (NORMODYNE) 100 MG tablet Take 200 mg by mouth 2 (two) times daily.   Multiple Vitamins-Minerals (COMPLETE) TABS Take by mouth.   oxycodone (OXY-IR) 5 MG capsule Take 5 mg by mouth as needed.   Semaglutide, 1 MG/DOSE, 4 MG/3ML SOPN Inject 1 mg as directed once a week.   sulfamethoxazole-trimethoprim (BACTRIM DS) 800-160 MG tablet Take 1 tablet by mouth 2 (two) times daily.   tamsulosin (FLOMAX) 0.4 MG CAPS capsule Take by mouth.   [DISCONTINUED] glipiZIDE (GLUCOTROL XL) 10 MG 24 hr tablet Take 10 mg by mouth daily.   [DISCONTINUED] Semaglutide,0.25 or 0.'5MG'$ /DOS, (OZEMPIC, 0.25 OR  0.5 MG/DOSE,) 2 MG/3ML SOPN Inject 0.5 mg into the skin once a week.   hydrochlorothiazide (HYDRODIURIL) 25 MG tablet Take by mouth.   meloxicam (MOBIC) 15 MG tablet Take 15 mg by mouth daily. (Patient not taking: Reported on 05/21/2022)   No facility-administered encounter medications on file as of 09/24/2022.    ALLERGIES: Allergies  Allergen Reactions   Farxiga [Dapagliflozin] Rash   Losartan Swelling, Palpitations and Rash   Penicillins Itching and Rash   Tetracyclines & Related Rash    VACCINATION STATUS: Immunization History  Administered Date(s) Administered   PFIZER Comirnaty(Gray Top)Covid-19 Tri-Sucrose Vaccine 10/24/2019, 11/15/2019, 07/20/2020   PFIZER(Purple Top)SARS-COV-2 Vaccination 10/24/2019    Diabetes He presents for his follow-up diabetic visit. He has type 2 diabetes mellitus. Onset time: Diagnosed at approx age of 67. His disease course has been improving. There are no hypoglycemic associated symptoms. Associated symptoms include fatigue. Pertinent negatives for diabetes include  no blurred vision, no foot ulcerations, no polydipsia and no polyuria. There are no hypoglycemic complications. Symptoms are improving. Diabetic complications include nephropathy and retinopathy. Risk factors for coronary artery disease include diabetes mellitus, dyslipidemia, family history, male sex, obesity, hypertension and sedentary lifestyle. Current diabetic treatment includes oral agent (monotherapy) (and Ozempic). He is compliant with treatment most of the time. His weight is decreasing steadily. He is following a generally unhealthy diet. When asked about meal planning, he reported none. He has had a previous visit with a dietitian (has seen dietician in distant past). His home blood glucose trend is decreasing steadily. His breakfast blood glucose range is generally 90-110 mg/dl. His bedtime blood glucose range is generally 110-130 mg/dl. (He presents today, accompanied by family member, with his glucose logs showing at goal glycemic profile.  His POCT A1c today is 5.9%, improving from last visit of 6%.  He denies any hypoglycemia.) An ACE inhibitor/angiotensin II receptor blocker is not being taken. He does not see a podiatrist.Eye exam is current.  Hyperlipidemia This is a chronic problem. The current episode started more than 1 year ago. Exacerbating diseases include chronic renal disease, diabetes and obesity. Factors aggravating his hyperlipidemia include thiazides and fatty foods. Current antihyperlipidemic treatment includes statins. Compliance problems include adherence to diet and adherence to exercise.  Risk factors for coronary artery disease include diabetes mellitus, dyslipidemia, family history, male sex, obesity, hypertension and a sedentary lifestyle.  Hypertension This is a chronic problem. The current episode started more than 1 year ago. The problem is unchanged. The problem is uncontrolled. Pertinent negatives include no blurred vision. There are no associated agents to  hypertension. Risk factors for coronary artery disease include dyslipidemia, diabetes mellitus, family history, obesity, male gender and sedentary lifestyle. Past treatments include diuretics and calcium channel blockers. The current treatment provides mild improvement. Compliance problems include diet and exercise.  Hypertensive end-organ damage includes kidney disease and retinopathy. Identifiable causes of hypertension include chronic renal disease and sleep apnea.     Review of systems  Constitutional: + steadily decreasing weight,  current Body mass index is 44.91 kg/m. , no fatigue, no subjective hyperthermia, no subjective hypothermia Eyes: no blurry vision, no xerophthalmia ENT: no sore throat, no nodules palpated in throat, no dysphagia/odynophagia, no hoarseness Cardiovascular: no chest pain, no shortness of breath, no palpitations, no leg swelling Respiratory: no cough, no shortness of breath Gastrointestinal: no nausea/vomiting/diarrhea Musculoskeletal: + generalized muscle aches- walks with cane Skin: no rashes, no hyperemia Neurological: no tremors, no numbness, no tingling, no dizziness Psychiatric: no depression, no  anxiety  Objective:     BP 133/65 (BP Location: Left Arm, Patient Position: Sitting, Cuff Size: Large)   Pulse (!) 59   Ht '5\' 11"'$  (1.803 m)   Wt (!) 322 lb (146.1 kg)   BMI 44.91 kg/m   Wt Readings from Last 3 Encounters:  09/24/22 (!) 322 lb (146.1 kg)  05/21/22 (!) 324 lb 12.8 oz (147.3 kg)  01/18/22 (!) 336 lb (152.4 kg)     BP Readings from Last 3 Encounters:  09/24/22 133/65  05/21/22 114/68  01/18/22 (!) 160/83     Physical Exam- Limited  Constitutional:  Body mass index is 44.91 kg/m. , not in acute distress, distracted state of mind with frequent interruptions Eyes:  EOMI, no exophthalmos Neck: Supple Musculoskeletal: no gross deformities, strength intact in all four extremities, no gross restriction of joint movements, walks with  cane Skin:  no rashes, no hyperemia Neurological: no tremor with outstretched hands   Diabetic Foot Exam - Simple   No data filed     CMP ( most recent) CMP     Component Value Date/Time   BUN 23 (A) 11/09/2020 0000   CREATININE 1.8 (A) 11/09/2020 0000   ALKPHOS 131 (A) 11/09/2020 0000   GFRNONAA 40 11/09/2020 0000     Diabetic Labs (most recent): Lab Results  Component Value Date   HGBA1C 5.9 (A) 09/24/2022   HGBA1C 6.0 (A) 05/21/2022   HGBA1C 7.1 (A) 01/18/2022     Lipid Panel ( most recent) Lipid Panel  No results found for: "CHOL", "TRIG", "HDL", "CHOLHDL", "VLDL", "LDLCALC", "LDLDIRECT", "LABVLDL"    No results found for: "TSH", "FREET4"         Assessment & Plan:   1) Type 2 diabetes mellitus with stage 3b chronic kidney disease, without long-term current use of insulin ([place])  He presents today, accompanied by family member, with his glucose logs showing at goal glycemic profile.  His POCT A1c today is 5.9%, improving from last visit of 6%.  He denies any hypoglycemia.  - TOWNES FUHS has currently uncontrolled symptomatic type 2 DM since 67 years of age.   -Recent labs reviewed.  - I had a long discussion with him about the progressive nature of diabetes and the pathology behind its complications. -his diabetes is complicated by nonhealing diabetic foot ulcer, PAD, CKD, retinopathy and he remains at a high risk for more acute and chronic complications which include CAD, CVA, CKD, retinopathy, and neuropathy. These are all discussed in detail with him.  - Nutritional counseling repeated at each appointment due to patients tendency to fall back in to old habits.  - The patient admits there is a room for improvement in their diet and drink choices. -  Suggestion is made for the patient to avoid simple carbohydrates from their diet including Cakes, Sweet Desserts / Pastries, Ice Cream, Soda (diet and regular), Sweet Tea, Candies, Chips, Cookies, Sweet  Pastries, Store Bought Juices, Alcohol in Excess of 1-2 drinks a day, Artificial Sweeteners, Coffee Creamer, and "Sugar-free" Products. This will help patient to have stable blood glucose profile and potentially avoid unintended weight gain.   - I encouraged the patient to switch to unprocessed or minimally processed complex starch and increased protein intake (animal or plant source), fruits, and vegetables.   - Patient is advised to stick to a routine mealtimes to eat 3 meals a day and avoid unnecessary snacks (to snack only to correct hypoglycemia).  - I have approached him with the  following individualized plan to manage his diabetes and patient agrees:   -He will benefit from increase in his Ozempic to 1 mg SQ weekly (he is meeting with pharmacist for patient assistance program later this week).  He is advised to stop his Glipizide altogether at this time to prevent hypoglycemia.   -He is encouraged to continue monitoring blood glucose twice daily, at breakfast and before bed, and to call the clinic if he has readings less than 70 or above 300 for 3 tests in a row.  - Adjustment parameters are given to him for hypo and hyperglycemia in writing.  - he is not a candidate for Metformin due to concurrent renal insufficiency.  - he did not tolerate Metformin, Rybelsus, Tradjenta, or Farxiga in the past.  - Specific targets for  A1c; LDL, HDL, and Triglycerides were discussed with the patient.  2) Blood Pressure /Hypertension:  his blood pressure is controlled to target.   he is advised to continue his current medications including Norvasc 10 mg p.o. daily with breakfast, and HCTZ 25 mg po daily.  3) Lipids/Hyperlipidemia:    His recent lipid panel from 12/06/21 shows controlled LDL of 71 and elevated triglycerides of 160.  he is advised to continue Lipitor 40 mg daily at bedtime.  Side effects and precautions discussed with him.  He recently had labs done by his PCP, will request copy.  4)   Weight/Diet:  his Body mass index is 44.91 kg/m.  -  clearly complicating his diabetes care.   he is a candidate for weight loss. I discussed with him the fact that loss of 5 - 10% of his  current body weight will have the most impact on his diabetes management.  Exercise, and detailed carbohydrates information provided  -  detailed on discharge instructions.  5) Chronic Care/Health Maintenance: -he is not on ACEI/ARB and is on Statin medications and is encouraged to initiate and continue to follow up with Ophthalmology, Dentist, Podiatrist at least yearly or according to recommendations, and advised to stay away from smoking. I have recommended yearly flu vaccine and pneumonia vaccine at least every 5 years; moderate intensity exercise for up to 150 minutes weekly; and sleep for at least 7 hours a day.  - he is advised to maintain close follow up with Bonnita Hollow, MD for primary care needs, as well as his other providers for optimal and coordinated care.      I spent 42 minutes in the care of the patient today including review of labs from [facility], Lipids, Thyroid Function, Hematology (current and previous including abstractions from other facilities); face-to-face time discussing  his blood glucose readings/logs, discussing hypoglycemia and hyperglycemia episodes and symptoms, medications doses, his options of short and long term treatment based on the latest standards of care / guidelines;  discussion about incorporating lifestyle medicine;  and documenting the encounter. Risk reduction counseling performed per USPSTF guidelines to reduce obesity and cardiovascular risk factors.     Please refer to Patient Instructions for Blood Glucose Monitoring and Insulin/Medications Dosing Guide"  in media tab for additional information. Please  also refer to " Patient Self Inventory" in the Media  tab for reviewed elements of pertinent patient history.  Graylin Shiver participated in the discussions,  expressed understanding, and voiced agreement with the above plans.  All questions were answered to his satisfaction. he is encouraged to contact clinic should he have any questions or concerns prior to his return visit.  Follow up plan: - Return in about 4 months (around 01/23/2023) for Diabetes F/U with A1c in office, No previsit labs, Bring meter and logs.   Rayetta Pigg, [facility] [address] [place], [place] [address] Phone: [phone] Fax: [phone]  09/24/2022, 4:31 PM

## 2022-10-07 IMAGING — US US SCROTUM W/ DOPPLER COMPLETE
1 series · 14 of 25 positions shown · non-contrast
Comparison: October 20, 2020.

CLINICAL DATA: Testicular mass for several months.

EXAM:
SCROTAL ULTRASOUND
DOPPLER ULTRASOUND OF THE TESTICLES
TECHNIQUE: Complete ultrasound examination of the testicles, epididymis, and
other scrotal structures was performed. Color and spectral Doppler
ultrasound were also utilized to evaluate blood flow to the
testicles.

[Series 1: us scrotum w/doppler · 14 of 76 slices shown]
[im 1/76]
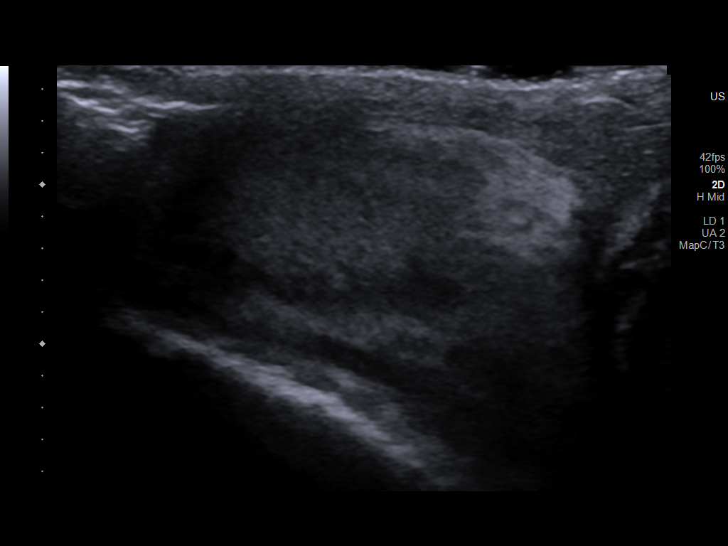
[im 7/76]
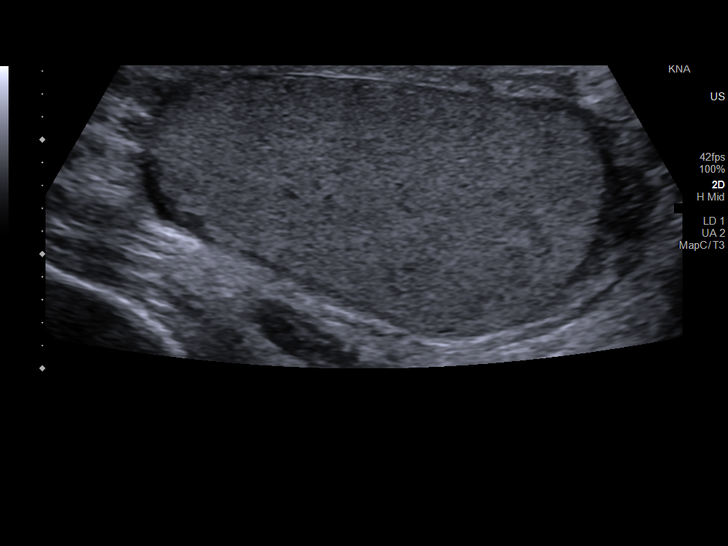
[im 13/76]
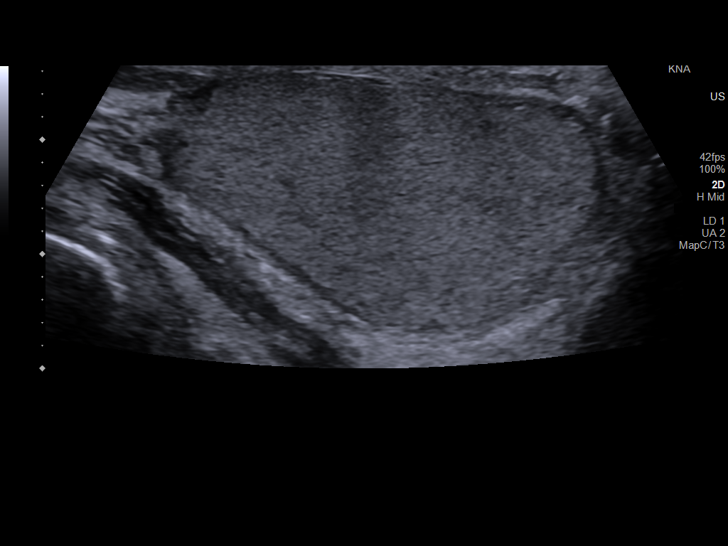
[im 19/76]
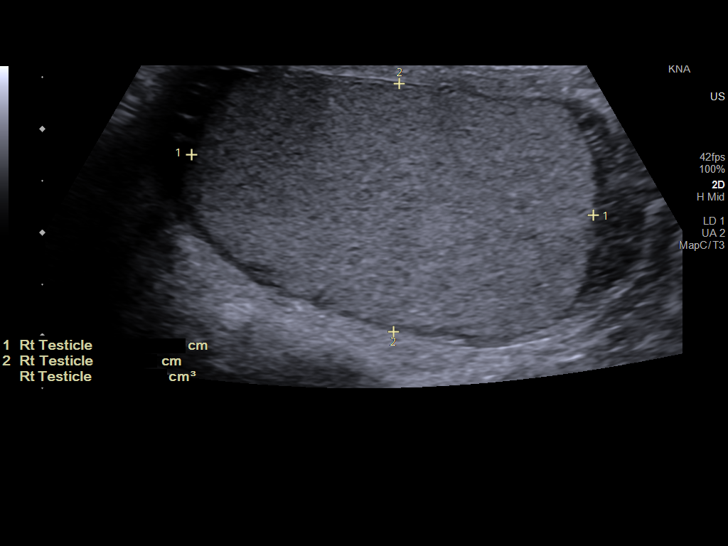
[im 26/76]
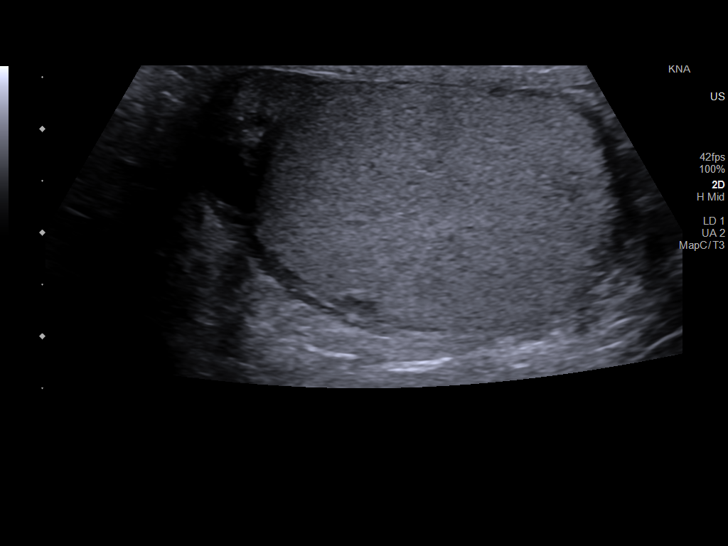
[im 29/76]
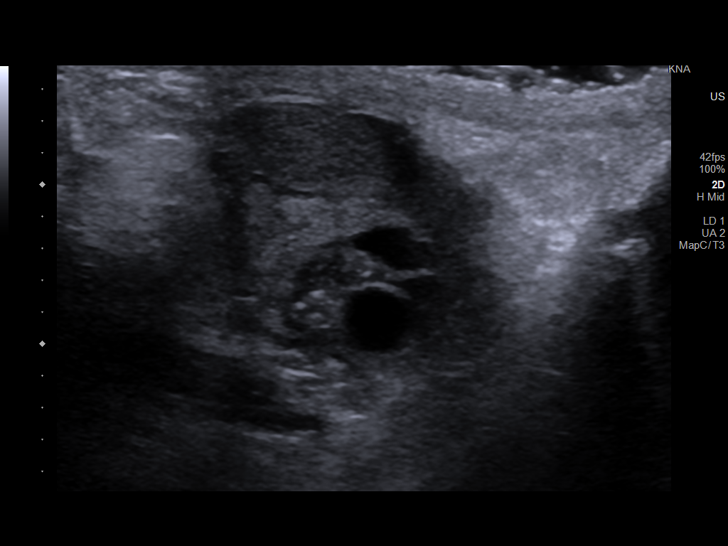
[im 35/76]
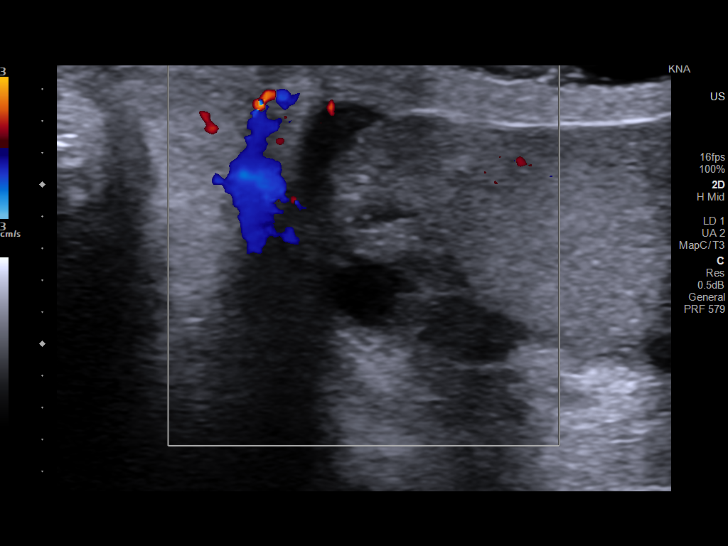
[im 41/76]
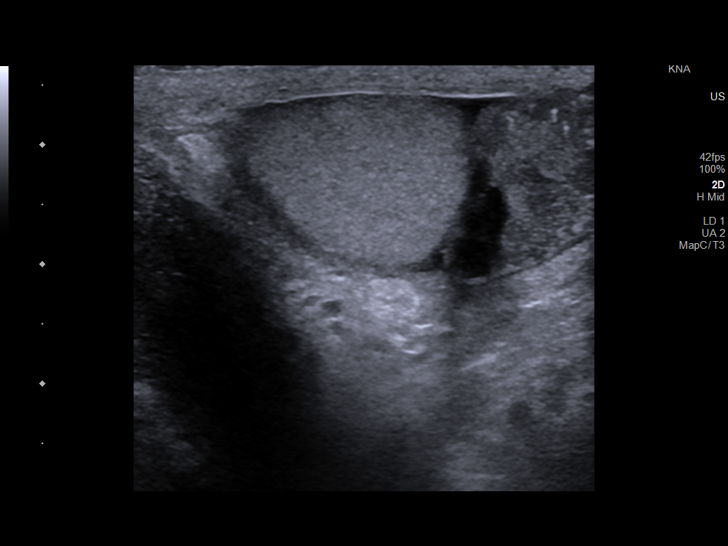
[im 47/76]
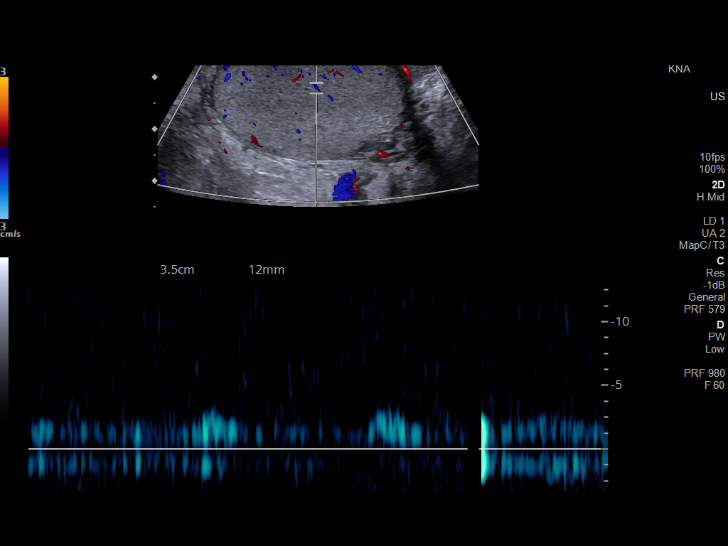
[im 51/76]
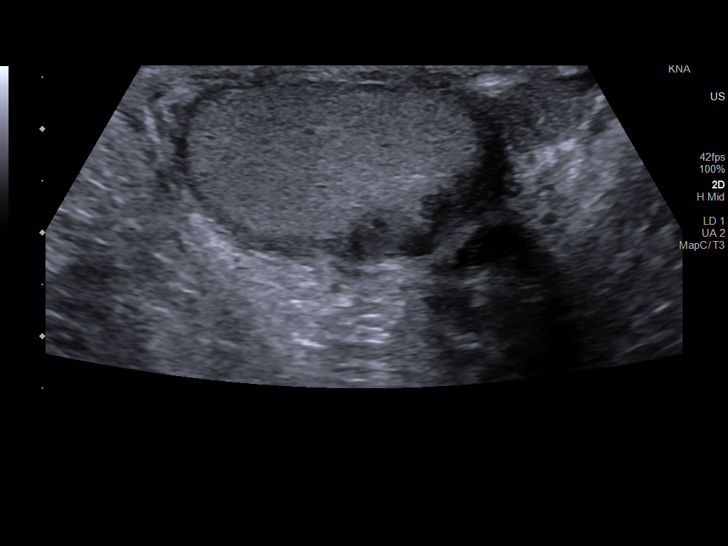
[im 57/76]
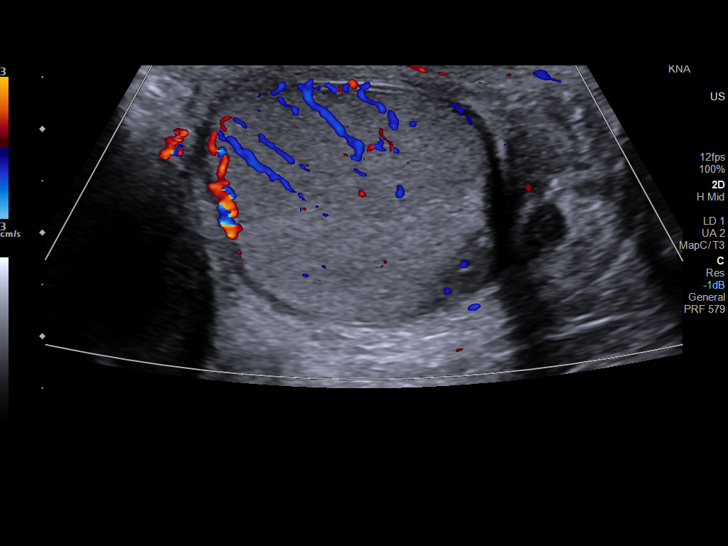
[im 63/76]
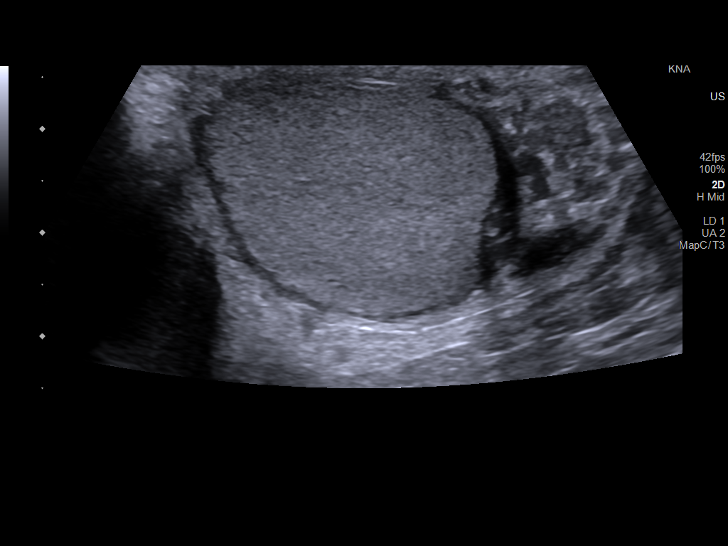
[im 69/76]
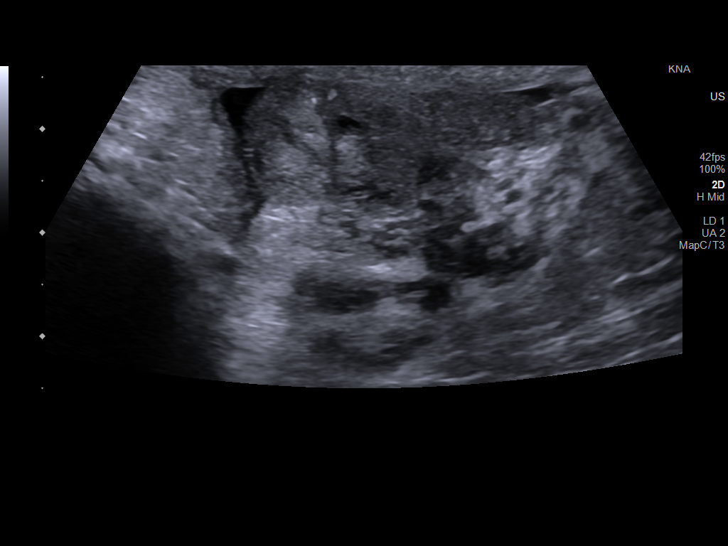
[im 76/76]
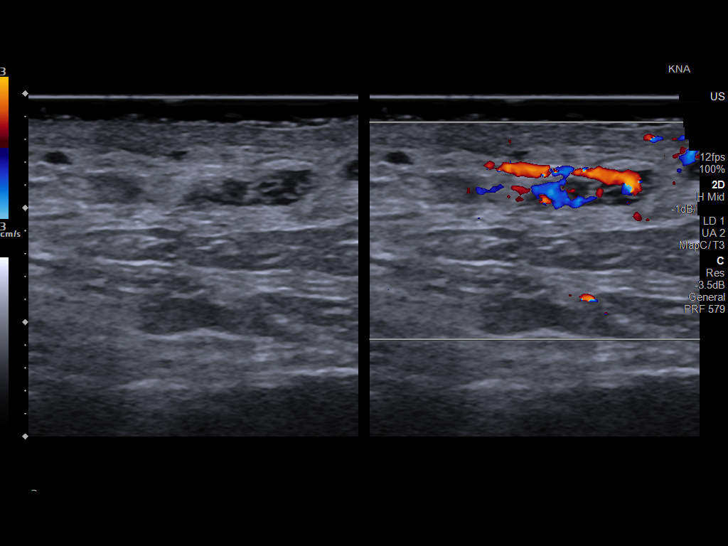

[14 of 25 positions shown; findings below may reference images not displayed]

FINDINGS: Right testicle

Measurements: 3.9 x 4.2 x 2.4 cm. No mass or microlithiasis
visualized.

Left testicle

Measurements: 3.1 x 3.7 x 2.3 cm. No mass or microlithiasis
visualized.

Right epididymis: Multiple cysts are noted, the largest measuring 7
mm.

Left epididymis:  Multiple small cysts are noted.

Hydrocele:  Small bilateral hydroceles are noted.

Varicocele:  None visualized.

Pulsed Doppler interrogation of both testes demonstrates normal low
resistance arterial and venous waveforms bilaterally.
IMPRESSION: No evidence of testicular mass or torsion. Small bilateral
hydroceles are noted. Small bilateral epididymal cysts are noted.

## 2022-10-12 DIAGNOSIS — R809 Proteinuria, unspecified: Secondary | ICD-10-CM | POA: Diagnosis not present

## 2022-10-12 DIAGNOSIS — I878 Other specified disorders of veins: Secondary | ICD-10-CM | POA: Diagnosis not present

## 2022-10-12 DIAGNOSIS — E1165 Type 2 diabetes mellitus with hyperglycemia: Secondary | ICD-10-CM | POA: Diagnosis not present

## 2022-10-12 DIAGNOSIS — K649 Unspecified hemorrhoids: Secondary | ICD-10-CM | POA: Diagnosis not present

## 2022-10-12 DIAGNOSIS — R42 Dizziness and giddiness: Secondary | ICD-10-CM | POA: Diagnosis not present

## 2022-10-12 DIAGNOSIS — I10 Essential (primary) hypertension: Secondary | ICD-10-CM | POA: Diagnosis not present

## 2022-11-13 DIAGNOSIS — H2513 Age-related nuclear cataract, bilateral: Secondary | ICD-10-CM | POA: Diagnosis not present

## 2022-11-13 DIAGNOSIS — Z7984 Long term (current) use of oral hypoglycemic drugs: Secondary | ICD-10-CM | POA: Diagnosis not present

## 2022-11-13 DIAGNOSIS — E119 Type 2 diabetes mellitus without complications: Secondary | ICD-10-CM | POA: Diagnosis not present

## 2023-01-23 ENCOUNTER — Encounter: Payer: Self-pay | Admitting: Nurse Practitioner

## 2023-01-23 ENCOUNTER — Ambulatory Visit: Payer: Medicare Other | Admitting: Nurse Practitioner

## 2023-01-23 VITALS — BP 132/73 | HR 69 | Ht 71.0 in | Wt 323.6 lb

## 2023-01-23 DIAGNOSIS — E1122 Type 2 diabetes mellitus with diabetic chronic kidney disease: Secondary | ICD-10-CM

## 2023-01-23 DIAGNOSIS — E782 Mixed hyperlipidemia: Secondary | ICD-10-CM | POA: Diagnosis not present

## 2023-01-23 DIAGNOSIS — I10 Essential (primary) hypertension: Secondary | ICD-10-CM | POA: Diagnosis not present

## 2023-01-23 DIAGNOSIS — Z7985 Long-term (current) use of injectable non-insulin antidiabetic drugs: Secondary | ICD-10-CM

## 2023-01-23 DIAGNOSIS — N1832 Chronic kidney disease, stage 3b: Secondary | ICD-10-CM

## 2023-01-23 LAB — POCT GLYCOSYLATED HEMOGLOBIN (HGB A1C): Hemoglobin A1C: 6.9 % — AB (ref 4.0–5.6)

## 2023-01-23 MED ORDER — SEMAGLUTIDE (2 MG/DOSE) 8 MG/3ML ~~LOC~~ SOPN: 2.0000 mg | PEN_INJECTOR | SUBCUTANEOUS | 3 refills | Status: AC

## 2023-01-23 NOTE — Progress Notes (Signed)
Endocrinology Follow Up Note       01/23/2023, 10:34 AM   Subjective:    Patient ID: Adam Grant, male    DOB: 11/30/1955.  Adam Grant is being seen in follow up after being seen in consultation for management of currently uncontrolled symptomatic diabetes requested by  Garnette Gunner, MD.   Past Medical History:  Diagnosis Date   Diabetes mellitus without complication (HCC)    Hypertension    Obstructive sleep apnea     Past Surgical History:  Procedure Laterality Date   EYE SURGERY Bilateral    1978, 2001   SPINE SURGERY     Spine Cancer 2019    Social History   Socioeconomic History   Marital status: Divorced    Spouse name: Not on file   Number of children: 3   Years of education: Not on file   Highest education level: Not on file  Occupational History   Occupation: elect. tech  Tobacco Use   Smoking status: Never    Passive exposure: Past   Smokeless tobacco: Never  Vaping Use   Vaping Use: Never used  Substance and Sexual Activity   Alcohol use: Yes    Comment: ocass.    Drug use: No   Sexual activity: Not on file  Other Topics Concern   Not on file  Social History Narrative   Not on file   Social Determinants of Health   Financial Resource Strain: Not on file  Food Insecurity: Not on file  Transportation Needs: Not on file  Physical Activity: Not on file  Stress: Not on file  Social Connections: Not on file    Family History  Problem Relation Age of Onset   Cancer Mother    Hypertension Mother    Hyperlipidemia Mother    Hypertension Father    Cancer Father    Heart attack Father    Heart failure Father     Outpatient Encounter Medications as of 01/23/2023  Medication Sig   ACCU-CHEK GUIDE test strip Use to monitor glucose 4 times daily, before meals and before bed.   Accu-Chek Softclix Lancets lancets Use as instructed to monitor glucose 4 times daily    acetaminophen (TYLENOL) 325 MG tablet Take by mouth.   amLODipine (NORVASC) 10 MG tablet    aspirin EC 81 MG tablet Take by mouth.   atorvastatin (LIPITOR) 40 MG tablet Take by mouth.   Cholecalciferol (VITAMIN D-3 PO) Take 1,000 mg by mouth daily.   Cholecalciferol (VITAMIN D3 PO) Take by mouth.   labetalol (NORMODYNE) 100 MG tablet Take 200 mg by mouth 2 (two) times daily.   Multiple Vitamins-Minerals (COMPLETE) TABS Take by mouth.   oxycodone (OXY-IR) 5 MG capsule Take 5 mg by mouth as needed.   Semaglutide, 2 MG/DOSE, 8 MG/3ML SOPN Inject 2 mg as directed once a week.   sulfamethoxazole-trimethoprim (BACTRIM DS) 800-160 MG tablet Take 1 tablet by mouth 2 (two) times daily.   tamsulosin (FLOMAX) 0.4 MG CAPS capsule Take by mouth.   [DISCONTINUED] Semaglutide, 1 MG/DOSE, 4 MG/3ML SOPN Inject 1 mg as directed once a week.   hydrochlorothiazide (HYDRODIURIL) 25 MG tablet Take by  mouth.   meloxicam (MOBIC) 15 MG tablet Take 15 mg by mouth daily. (Patient not taking: Reported on 05/21/2022)   No facility-administered encounter medications on file as of 01/23/2023.    ALLERGIES: Allergies  Allergen Reactions   Farxiga [Dapagliflozin] Rash   Losartan Swelling, Palpitations and Rash   Penicillins Itching and Rash   Tetracyclines & Related Rash    VACCINATION STATUS: Immunization History  Administered Date(s) Administered   PFIZER Comirnaty(Gray Top)Covid-19 Tri-Sucrose Vaccine 10/24/2019, 11/15/2019, 07/20/2020   PFIZER(Purple Top)SARS-COV-2 Vaccination 10/24/2019    Diabetes He presents for his follow-up diabetic visit. He has type 2 diabetes mellitus. Onset time: Diagnosed at approx age of 14. His disease course has been stable. There are no hypoglycemic associated symptoms. Associated symptoms include fatigue. Pertinent negatives for diabetes include no blurred vision, no foot ulcerations, no polydipsia and no polyuria. There are no hypoglycemic complications. Symptoms are improving.  Diabetic complications include nephropathy and retinopathy. Risk factors for coronary artery disease include diabetes mellitus, dyslipidemia, family history, male sex, obesity, hypertension and sedentary lifestyle. Current diabetic treatment includes oral agent (monotherapy) (and Ozempic). He is compliant with treatment most of the time. His weight is fluctuating minimally. He is following a generally unhealthy diet. When asked about meal planning, he reported none. He has had a previous visit with a dietitian (has seen dietician in distant past). His home blood glucose trend is fluctuating minimally. His breakfast blood glucose range is generally 140-180 mg/dl. His bedtime blood glucose range is generally 180-200 mg/dl. (He presents today with his logs showing above target glycemic profile overall.  His POCT A1c today is 6.9%, increasing from last visit of 5.9%.  He denies any hypoglycemia.  He notes he still eats foods he knows he shouldn't (fried foods and diet sodas).) An ACE inhibitor/angiotensin II receptor blocker is not being taken. He does not see a podiatrist.Eye exam is current.  Hyperlipidemia This is a chronic problem. The current episode started more than 1 year ago. Exacerbating diseases include chronic renal disease, diabetes and obesity. Factors aggravating his hyperlipidemia include thiazides and fatty foods. Current antihyperlipidemic treatment includes statins. Compliance problems include adherence to diet and adherence to exercise.  Risk factors for coronary artery disease include diabetes mellitus, dyslipidemia, family history, male sex, obesity, hypertension and a sedentary lifestyle.  Hypertension This is a chronic problem. The current episode started more than 1 year ago. The problem is unchanged. The problem is uncontrolled. Pertinent negatives include no blurred vision. There are no associated agents to hypertension. Risk factors for coronary artery disease include dyslipidemia,  diabetes mellitus, family history, obesity, male gender and sedentary lifestyle. Past treatments include diuretics and calcium channel blockers. The current treatment provides mild improvement. Compliance problems include diet and exercise.  Hypertensive end-organ damage includes kidney disease and retinopathy. Identifiable causes of hypertension include chronic renal disease and sleep apnea.     Review of systems  Constitutional: + stable weight,  current Body mass index is 45.13 kg/m. , no fatigue, no subjective hyperthermia, no subjective hypothermia Eyes: no blurry vision, no xerophthalmia ENT: no sore throat, no nodules palpated in throat, no dysphagia/odynophagia, no hoarseness Cardiovascular: no chest pain, no shortness of breath, no palpitations, no leg swelling Respiratory: no cough, no shortness of breath Gastrointestinal: no nausea/vomiting/diarrhea Musculoskeletal: + generalized muscle aches- walks with cane Skin: no rashes, no hyperemia Neurological: no tremors, no numbness, no tingling, no dizziness Psychiatric: no depression, no anxiety  Objective:     BP 132/73 (BP Location: Left  Arm, Patient Position: Sitting, Cuff Size: Large)   Pulse 69   Ht 5\' 11"  (1.803 m)   Wt (!) 323 lb 9.6 oz (146.8 kg)   BMI 45.13 kg/m   Wt Readings from Last 3 Encounters:  01/23/23 (!) 323 lb 9.6 oz (146.8 kg)  09/24/22 (!) 322 lb (146.1 kg)  05/21/22 (!) 324 lb 12.8 oz (147.3 kg)     BP Readings from Last 3 Encounters:  01/23/23 132/73  09/24/22 133/65  05/21/22 114/68     Physical Exam- Limited  Constitutional:  Body mass index is 45.13 kg/m. , not in acute distress, distracted state of mind with frequent interruptions Eyes:  EOMI, no exophthalmos Neck: Supple Musculoskeletal: no gross deformities, strength intact in all four extremities, no gross restriction of joint movements, walks with cane Skin:  no rashes, no hyperemia Neurological: no tremor with outstretched  hands   Diabetic Foot Exam - Simple   No data filed     CMP ( most recent) CMP     Component Value Date/Time   BUN 23 (A) 11/09/2020 0000   CREATININE 1.8 (A) 11/09/2020 0000   ALKPHOS 131 (A) 11/09/2020 0000   GFRNONAA 40 11/09/2020 0000     Diabetic Labs (most recent): Lab Results  Component Value Date   HGBA1C 6.9 (A) 01/23/2023   HGBA1C 5.9 (A) 09/24/2022   HGBA1C 6.0 (A) 05/21/2022     Lipid Panel ( most recent) Lipid Panel  No results found for: "CHOL", "TRIG", "HDL", "CHOLHDL", "VLDL", "LDLCALC", "LDLDIRECT", "LABVLDL"    No results found for: "TSH", "FREET4"         Assessment & Plan:   1) Type 2 diabetes mellitus with stage 3b chronic kidney disease, without long-term current use of insulin (HCC)  He presents today with his logs showing above target glycemic profile overall.  His POCT A1c today is 6.9%, increasing from last visit of 5.9%.  He denies any hypoglycemia.  He notes he still eats foods he knows he shouldn't (fried foods and diet sodas).  - Adam Grant has currently uncontrolled symptomatic type 2 DM since 67 years of age.   -Recent labs reviewed.  - I had a long discussion with him about the progressive nature of diabetes and the pathology behind its complications. -his diabetes is complicated by nonhealing diabetic foot ulcer, PAD, CKD, retinopathy and he remains at a high risk for more acute and chronic complications which include CAD, CVA, CKD, retinopathy, and neuropathy. These are all discussed in detail with him.  - Nutritional counseling repeated at each appointment due to patients tendency to fall back in to old habits.  - The patient admits there is a room for improvement in their diet and drink choices. -  Suggestion is made for the patient to avoid simple carbohydrates from their diet including Cakes, Sweet Desserts / Pastries, Ice Cream, Soda (diet and regular), Sweet Tea, Candies, Chips, Cookies, Sweet Pastries, Store Bought  Juices, Alcohol in Excess of 1-2 drinks a day, Artificial Sweeteners, Coffee Creamer, and "Sugar-free" Products. This will help patient to have stable blood glucose profile and potentially avoid unintended weight gain.   - I encouraged the patient to switch to unprocessed or minimally processed complex starch and increased protein intake (animal or plant source), fruits, and vegetables.   - Patient is advised to stick to a routine mealtimes to eat 3 meals a day and avoid unnecessary snacks (to snack only to correct hypoglycemia).  - I have approached him  with the following individualized plan to manage his diabetes and patient agrees:   -He will benefit from increase in his Ozempic to 2 mg SQ weekly (he gets patient assistance through his PCP for this medication- I encouraged him to reach out to get this increased).  He can stay off Glipizide for now.  -He is encouraged to continue monitoring blood glucose twice daily, at breakfast and before bed, and to call the clinic if he has readings less than 70 or above 300 for 3 tests in a row.  - Adjustment parameters are given to him for hypo and hyperglycemia in writing.  - he is not a candidate for Metformin due to concurrent renal insufficiency.  - he did not tolerate Metformin, Rybelsus, Tradjenta, or Farxiga in the past.  - Specific targets for  A1c; LDL, HDL, and Triglycerides were discussed with the patient.  2) Blood Pressure /Hypertension:  his blood pressure is controlled to target.   he is advised to continue his current medications including Norvasc 10 mg p.o. daily with breakfast, and HCTZ 25 mg po daily.  3) Lipids/Hyperlipidemia:    His recent lipid panel from 12/06/21 shows controlled LDL of 71 and elevated triglycerides of 160.  he is advised to continue Lipitor 40 mg daily at bedtime.  Side effects and precautions discussed with him.  He recently had labs done by his PCP, will request copy.  4)  Weight/Diet:  his Body mass index  is 45.13 kg/m.  -  clearly complicating his diabetes care.   he is a candidate for weight loss. I discussed with him the fact that loss of 5 - 10% of his  current body weight will have the most impact on his diabetes management.  Exercise, and detailed carbohydrates information provided  -  detailed on discharge instructions.  5) Chronic Care/Health Maintenance: -he is not on ACEI/ARB and is on Statin medications and is encouraged to initiate and continue to follow up with Ophthalmology, Dentist, Podiatrist at least yearly or according to recommendations, and advised to stay away from smoking. I have recommended yearly flu vaccine and pneumonia vaccine at least every 5 years; moderate intensity exercise for up to 150 minutes weekly; and sleep for at least 7 hours a day.  - he is advised to maintain close follow up with Garnette Gunner, MD for primary care needs, as well as his other providers for optimal and coordinated care.      I spent  40  minutes in the care of the patient today including review of labs from CMP, Lipids, Thyroid Function, Hematology (current and previous including abstractions from other facilities); face-to-face time discussing  his blood glucose readings/logs, discussing hypoglycemia and hyperglycemia episodes and symptoms, medications doses, his options of short and long term treatment based on the latest standards of care / guidelines;  discussion about incorporating lifestyle medicine;  and documenting the encounter. Risk reduction counseling performed per USPSTF guidelines to reduce obesity and cardiovascular risk factors.     Please refer to Patient Instructions for Blood Glucose Monitoring and Insulin/Medications Dosing Guide"  in media tab for additional information. Please  also refer to " Patient Self Inventory" in the Media  tab for reviewed elements of pertinent patient history.  Candis Shine participated in the discussions, expressed understanding, and voiced  agreement with the above plans.  All questions were answered to his satisfaction. he is encouraged to contact clinic should he have any questions or concerns prior to his return  visit.     Follow up plan: - Return in about 4 months (around 05/26/2023) for Diabetes F/U with A1c in office, No previsit labs.   Ronny Bacon, [facility] [address] [place], [place] [address] Phone: [phone] Fax: [phone]  01/23/2023, 10:34 AM

## 2023-02-01 DIAGNOSIS — I10 Essential (primary) hypertension: Secondary | ICD-10-CM | POA: Diagnosis not present

## 2023-02-01 DIAGNOSIS — E1169 Type 2 diabetes mellitus with other specified complication: Secondary | ICD-10-CM | POA: Diagnosis not present

## 2023-02-13 DIAGNOSIS — R269 Unspecified abnormalities of gait and mobility: Secondary | ICD-10-CM | POA: Diagnosis not present

## 2023-05-28 ENCOUNTER — Encounter: Payer: Self-pay | Admitting: Nurse Practitioner

## 2023-05-28 ENCOUNTER — Ambulatory Visit: Payer: Medicare Other | Admitting: Nurse Practitioner

## 2023-05-28 VITALS — BP 128/55 | HR 76 | Ht 71.0 in | Wt 306.6 lb

## 2023-05-28 DIAGNOSIS — N1832 Chronic kidney disease, stage 3b: Secondary | ICD-10-CM | POA: Diagnosis not present

## 2023-05-28 DIAGNOSIS — Z7985 Long-term (current) use of injectable non-insulin antidiabetic drugs: Secondary | ICD-10-CM | POA: Diagnosis not present

## 2023-05-28 DIAGNOSIS — E1122 Type 2 diabetes mellitus with diabetic chronic kidney disease: Secondary | ICD-10-CM | POA: Diagnosis not present

## 2023-05-28 DIAGNOSIS — I10 Essential (primary) hypertension: Secondary | ICD-10-CM

## 2023-05-28 DIAGNOSIS — E782 Mixed hyperlipidemia: Secondary | ICD-10-CM

## 2023-05-28 LAB — POCT GLYCOSYLATED HEMOGLOBIN (HGB A1C): Hemoglobin A1C: 6.6 % — AB (ref 4.0–5.6)

## 2023-05-28 NOTE — Progress Notes (Signed)
Endocrinology Follow Up Note       05/28/2023, 11:50 AM   Subjective:    Patient ID: Adam Grant, male    DOB: 1955-12-25.  Adam Grant is being seen in follow up after being seen in consultation for management of currently uncontrolled symptomatic diabetes requested by  Garnette Gunner, MD.   Past Medical History:  Diagnosis Date   Diabetes mellitus without complication (HCC)    Hypertension    Obstructive sleep apnea     Past Surgical History:  Procedure Laterality Date   EYE SURGERY Bilateral    1978, 2001   SPINE SURGERY     Spine Cancer 2019    Social History   Socioeconomic History   Marital status: Divorced    Spouse name: Not on file   Number of children: 3   Years of education: Not on file   Highest education level: Not on file  Occupational History   Occupation: elect. tech  Tobacco Use   Smoking status: Never    Passive exposure: Past   Smokeless tobacco: Never  Vaping Use   Vaping status: Never Used  Substance and Sexual Activity   Alcohol use: Yes    Comment: ocass.    Drug use: No   Sexual activity: Not on file  Other Topics Concern   Not on file  Social History Narrative   Not on file   Social Determinants of Health   Financial Resource Strain: Not on file  Food Insecurity: Not on file  Transportation Needs: Not on file  Physical Activity: Not on file  Stress: Not on file  Social Connections: Not on file    Family History  Problem Relation Age of Onset   Cancer Mother    Hypertension Mother    Hyperlipidemia Mother    Hypertension Father    Cancer Father    Heart attack Father    Heart failure Father     Outpatient Encounter Medications as of 05/28/2023  Medication Sig   ACCU-CHEK GUIDE test strip Use to monitor glucose 4 times daily, before meals and before bed.   Accu-Chek Softclix Lancets lancets Use as instructed to monitor glucose 4 times daily    acetaminophen (TYLENOL) 325 MG tablet Take by mouth.   amLODipine (NORVASC) 10 MG tablet    aspirin EC 81 MG tablet Take by mouth.   atorvastatin (LIPITOR) 40 MG tablet Take by mouth.   Cholecalciferol (VITAMIN D-3 PO) Take 1,000 mg by mouth daily.   Cholecalciferol (VITAMIN D3 PO) Take by mouth.   labetalol (NORMODYNE) 100 MG tablet Take 200 mg by mouth 2 (two) times daily.   meloxicam (MOBIC) 15 MG tablet Take 15 mg by mouth daily.   Multiple Vitamins-Minerals (COMPLETE) TABS Take by mouth.   oxycodone (OXY-IR) 5 MG capsule Take 5 mg by mouth as needed.   Semaglutide, 2 MG/DOSE, 8 MG/3ML SOPN Inject 2 mg as directed once a week.   sulfamethoxazole-trimethoprim (BACTRIM DS) 800-160 MG tablet Take 1 tablet by mouth 2 (two) times daily.   tamsulosin (FLOMAX) 0.4 MG CAPS capsule Take by mouth.   hydrochlorothiazide (HYDRODIURIL) 25 MG tablet Take by mouth.   No  facility-administered encounter medications on file as of 05/28/2023.    ALLERGIES: Allergies  Allergen Reactions   Farxiga [Dapagliflozin] Rash   Losartan Swelling, Palpitations and Rash   Penicillins Itching and Rash   Tetracyclines & Related Rash    VACCINATION STATUS: Immunization History  Administered Date(s) Administered   PFIZER Comirnaty(Gray Top)Covid-19 Tri-Sucrose Vaccine 10/24/2019, 11/15/2019, 07/20/2020    Diabetes He presents for his follow-up diabetic visit. He has type 2 diabetes mellitus. Onset time: Diagnosed at approx age of 60. His disease course has been stable. There are no hypoglycemic associated symptoms. Associated symptoms include fatigue. Pertinent negatives for diabetes include no blurred vision, no foot ulcerations, no polydipsia and no polyuria. There are no hypoglycemic complications. Symptoms are improving. Diabetic complications include nephropathy and retinopathy. Risk factors for coronary artery disease include diabetes mellitus, dyslipidemia, family history, male sex, obesity, hypertension  and sedentary lifestyle. Current diabetic treatment includes oral agent (monotherapy) (and Ozempic). He is compliant with treatment most of the time. His weight is fluctuating minimally. He is following a generally unhealthy diet. When asked about meal planning, he reported none. He has had a previous visit with a dietitian (has seen dietician in distant past). His home blood glucose trend is fluctuating minimally. His breakfast blood glucose range is generally 140-180 mg/dl. His bedtime blood glucose range is generally 180-200 mg/dl. (He presents today with his logs showing slightly above target fasting glycemic profile.  His POCT A1c today is 6.6%, improving from last visit of 6.9%.  He denies any hypoglycemia.  He notes he still eats foods he knows he shouldn't (fried foods and diet sodas but is doing so less often).  He does note some intermittent constipation as well.) An ACE inhibitor/angiotensin II receptor blocker is not being taken. He does not see a podiatrist.Eye exam is current.  Hyperlipidemia This is a chronic problem. The current episode started more than 1 year ago. Exacerbating diseases include chronic renal disease, diabetes and obesity. Factors aggravating his hyperlipidemia include thiazides and fatty foods. Current antihyperlipidemic treatment includes statins. Compliance problems include adherence to diet and adherence to exercise.  Risk factors for coronary artery disease include diabetes mellitus, dyslipidemia, family history, male sex, obesity, hypertension and a sedentary lifestyle.  Hypertension This is a chronic problem. The current episode started more than 1 year ago. The problem is unchanged. The problem is uncontrolled. Pertinent negatives include no blurred vision. There are no associated agents to hypertension. Risk factors for coronary artery disease include dyslipidemia, diabetes mellitus, family history, obesity, male gender and sedentary lifestyle. Past treatments include  diuretics and calcium channel blockers. The current treatment provides mild improvement. Compliance problems include diet and exercise.  Hypertensive end-organ damage includes kidney disease and retinopathy. Identifiable causes of hypertension include chronic renal disease and sleep apnea.     Review of systems  Constitutional: + steadily decreasing body weight,  current Body mass index is 42.76 kg/m. , no fatigue, no subjective hyperthermia, no subjective hypothermia Eyes: no blurry vision, no xerophthalmia ENT: no sore throat, no nodules palpated in throat, no dysphagia/odynophagia, no hoarseness Cardiovascular: no chest pain, no shortness of breath, no palpitations, no leg swelling Respiratory: no cough, no shortness of breath Gastrointestinal: no nausea/vomiting/diarrhea Musculoskeletal: + generalized muscle aches- walks with cane Skin: no rashes, no hyperemia Neurological: no tremors, no numbness, no tingling, no dizziness Psychiatric: no depression, no anxiety  Objective:     BP (!) 128/55 (BP Location: Right Arm, Patient Position: Sitting, Cuff Size: Large)   Pulse  76   Ht 5\' 11"  (1.803 m)   Wt (!) 306 lb 9.6 oz (139.1 kg)   BMI 42.76 kg/m   Wt Readings from Last 3 Encounters:  05/28/23 (!) 306 lb 9.6 oz (139.1 kg)  01/23/23 (!) 323 lb 9.6 oz (146.8 kg)  09/24/22 (!) 322 lb (146.1 kg)     BP Readings from Last 3 Encounters:  05/28/23 (!) 128/55  01/23/23 132/73  09/24/22 133/65     Physical Exam- Limited  Constitutional:  Body mass index is 42.76 kg/m. , not in acute distress, distracted state of mind with frequent interruptions Eyes:  EOMI, no exophthalmos Neck: Supple Musculoskeletal: no gross deformities, strength intact in all four extremities, no gross restriction of joint movements, walks with cane Skin:  no rashes, no hyperemia Neurological: no tremor with outstretched hands   Diabetic Foot Exam - Simple   Simple Foot Form Visual Inspection See  comments: Yes Sensation Testing Intact to touch and monofilament testing bilaterally: Yes Pulse Check Posterior Tibialis and Dorsalis pulse intact bilaterally: Yes Comments Mild onychomycosis bilaterally, dry flaky skin     CMP ( most recent) CMP     Component Value Date/Time   BUN 23 (A) 11/09/2020 0000   CREATININE 1.8 (A) 11/09/2020 0000   ALKPHOS 131 (A) 11/09/2020 0000   GFRNONAA 40 11/09/2020 0000     Diabetic Labs (most recent): Lab Results  Component Value Date   HGBA1C 6.6 (A) 05/28/2023   HGBA1C 6.9 (A) 01/23/2023   HGBA1C 5.9 (A) 09/24/2022     Lipid Panel ( most recent) Lipid Panel  No results found for: "CHOL", "TRIG", "HDL", "CHOLHDL", "VLDL", "LDLCALC", "LDLDIRECT", "LABVLDL"    No results found for: "TSH", "FREET4"         Assessment & Plan:   1) Type 2 diabetes mellitus with stage 3b chronic kidney disease, without long-term current use of insulin (HCC)  He presents today with his logs showing slightly above target fasting glycemic profile.  His POCT A1c today is 6.6%, improving from last visit of 6.9%.  He denies any hypoglycemia.  He notes he still eats foods he knows he shouldn't (fried foods and diet sodas but is doing so less often).  He does note some intermittent constipation as well.  - GEMAYEL BELISLE has currently uncontrolled symptomatic type 2 DM since 67 years of age.   -Recent labs reviewed.  - I had a long discussion with him about the progressive nature of diabetes and the pathology behind its complications. -his diabetes is complicated by nonhealing diabetic foot ulcer, PAD, CKD, retinopathy and he remains at a high risk for more acute and chronic complications which include CAD, CVA, CKD, retinopathy, and neuropathy. These are all discussed in detail with him.  - Nutritional counseling repeated at each appointment due to patients tendency to fall back in to old habits.  - The patient admits there is a room for improvement in  their diet and drink choices. -  Suggestion is made for the patient to avoid simple carbohydrates from their diet including Cakes, Sweet Desserts / Pastries, Ice Cream, Soda (diet and regular), Sweet Tea, Candies, Chips, Cookies, Sweet Pastries, Store Bought Juices, Alcohol in Excess of 1-2 drinks a day, Artificial Sweeteners, Coffee Creamer, and "Sugar-free" Products. This will help patient to have stable blood glucose profile and potentially avoid unintended weight gain.   - I encouraged the patient to switch to unprocessed or minimally processed complex starch and increased protein intake (animal or  plant source), fruits, and vegetables.   - Patient is advised to stick to a routine mealtimes to eat 3 meals a day and avoid unnecessary snacks (to snack only to correct hypoglycemia).  - I have approached him with the following individualized plan to manage his diabetes and patient agrees:   -He is advised to continue his Ozempic 2 mg SQ weekly (he gets patient assistance through his PCP for this medication).  He can stay off Glipizide for now.  He has lost 17 lbs since last visit, a good development for him.  We did discuss strategies to help alleviate constipation such as drinking plenty of water and eating fibrous fruits and veggies.  -He is encouraged to continue monitoring blood glucose twice daily, at breakfast and before bed, and to call the clinic if he has readings less than 70 or above 300 for 3 tests in a row.  - Adjustment parameters are given to him for hypo and hyperglycemia in writing.  - he is not a candidate for Metformin due to concurrent renal insufficiency.  - he did not tolerate Metformin, Rybelsus, Tradjenta, or Farxiga in the past.  - Specific targets for  A1c; LDL, HDL, and Triglycerides were discussed with the patient.  2) Blood Pressure /Hypertension:  his blood pressure is controlled to target.   he is advised to continue his current medications including Norvasc 10 mg  p.o. daily with breakfast, and HCTZ 25 mg po daily.  3) Lipids/Hyperlipidemia:    His recent lipid panel from 12/06/21 shows controlled LDL of 71 and elevated triglycerides of 160.  he is advised to continue Lipitor 40 mg daily at bedtime.  Side effects and precautions discussed with him.  He recently had labs done by his PCP, will request copy.  4)  Weight/Diet:  his Body mass index is 42.76 kg/m.  -  clearly complicating his diabetes care.   he is a candidate for weight loss. I discussed with him the fact that loss of 5 - 10% of his  current body weight will have the most impact on his diabetes management.  Exercise, and detailed carbohydrates information provided  -  detailed on discharge instructions.  5) Chronic Care/Health Maintenance: -he is not on ACEI/ARB and is on Statin medications and is encouraged to initiate and continue to follow up with Ophthalmology, Dentist, Podiatrist at least yearly or according to recommendations, and advised to stay away from smoking. I have recommended yearly flu vaccine and pneumonia vaccine at least every 5 years; moderate intensity exercise for up to 150 minutes weekly; and sleep for at least 7 hours a day.  - he is advised to maintain close follow up with Garnette Gunner, MD for primary care needs, as well as his other providers for optimal and coordinated care.      I spent  31  minutes in the care of the patient today including review of labs from CMP, Lipids, Thyroid Function, Hematology (current and previous including abstractions from other facilities); face-to-face time discussing  his blood glucose readings/logs, discussing hypoglycemia and hyperglycemia episodes and symptoms, medications doses, his options of short and long term treatment based on the latest standards of care / guidelines;  discussion about incorporating lifestyle medicine;  and documenting the encounter. Risk reduction counseling performed per USPSTF guidelines to reduce obesity  and cardiovascular risk factors.     Please refer to Patient Instructions for Blood Glucose Monitoring and Insulin/Medications Dosing Guide"  in media tab for additional information. Please  also refer to " Patient Self Inventory" in the Media  tab for reviewed elements of pertinent patient history.  Candis Shine participated in the discussions, expressed understanding, and voiced agreement with the above plans.  All questions were answered to his satisfaction. he is encouraged to contact clinic should he have any questions or concerns prior to his return visit.     Follow up plan: - Return in about 4 months (around 09/27/2023) for Diabetes F/U with A1c in office, No previsit labs, Bring meter and logs.   Ronny Bacon, [facility] [address] [place], [place] [address] Phone: [phone] Fax: [phone]  05/28/2023, 11:50 AM

## 2023-05-29 DIAGNOSIS — C720 Malignant neoplasm of spinal cord: Secondary | ICD-10-CM | POA: Diagnosis not present

## 2023-05-29 DIAGNOSIS — C719 Malignant neoplasm of brain, unspecified: Secondary | ICD-10-CM | POA: Diagnosis not present

## 2023-05-29 DIAGNOSIS — M47812 Spondylosis without myelopathy or radiculopathy, cervical region: Secondary | ICD-10-CM | POA: Diagnosis not present

## 2023-05-29 DIAGNOSIS — M4802 Spinal stenosis, cervical region: Secondary | ICD-10-CM | POA: Diagnosis not present

## 2023-06-04 DIAGNOSIS — E039 Hypothyroidism, unspecified: Secondary | ICD-10-CM | POA: Diagnosis not present

## 2023-06-04 DIAGNOSIS — E785 Hyperlipidemia, unspecified: Secondary | ICD-10-CM | POA: Diagnosis not present

## 2023-06-04 DIAGNOSIS — N183 Chronic kidney disease, stage 3 unspecified: Secondary | ICD-10-CM | POA: Diagnosis not present

## 2023-06-04 DIAGNOSIS — E559 Vitamin D deficiency, unspecified: Secondary | ICD-10-CM | POA: Diagnosis not present

## 2023-06-04 DIAGNOSIS — Z1329 Encounter for screening for other suspected endocrine disorder: Secondary | ICD-10-CM | POA: Diagnosis not present

## 2023-06-04 DIAGNOSIS — E7800 Pure hypercholesterolemia, unspecified: Secondary | ICD-10-CM | POA: Diagnosis not present

## 2023-06-04 DIAGNOSIS — E1169 Type 2 diabetes mellitus with other specified complication: Secondary | ICD-10-CM | POA: Diagnosis not present

## 2023-06-04 DIAGNOSIS — E1122 Type 2 diabetes mellitus with diabetic chronic kidney disease: Secondary | ICD-10-CM | POA: Diagnosis not present

## 2023-06-04 LAB — HEMOGLOBIN A1C: Hemoglobin A1C: 6.6

## 2023-06-04 LAB — LIPID PANEL
A1c: 6.6
EGFR: 45.6
LDL Cholesterol: 66
Triglycerides: 201 — AB (ref 40–160)

## 2023-06-04 LAB — BASIC METABOLIC PANEL
BUN: 21 (ref 4–21)
Creatinine: 1.6 — AB (ref 0.6–1.3)
Glucose: 150

## 2023-06-04 LAB — TSH: TSH: 2.85 (ref 0.41–5.90)

## 2023-06-04 LAB — COMPREHENSIVE METABOLIC PANEL: eGFR: 45.6

## 2023-06-11 DIAGNOSIS — I878 Other specified disorders of veins: Secondary | ICD-10-CM | POA: Diagnosis not present

## 2023-06-11 DIAGNOSIS — E1165 Type 2 diabetes mellitus with hyperglycemia: Secondary | ICD-10-CM | POA: Diagnosis not present

## 2023-06-11 DIAGNOSIS — R809 Proteinuria, unspecified: Secondary | ICD-10-CM | POA: Diagnosis not present

## 2023-06-11 DIAGNOSIS — Z23 Encounter for immunization: Secondary | ICD-10-CM | POA: Diagnosis not present

## 2023-06-11 DIAGNOSIS — Z0001 Encounter for general adult medical examination with abnormal findings: Secondary | ICD-10-CM | POA: Diagnosis not present

## 2023-06-11 DIAGNOSIS — R42 Dizziness and giddiness: Secondary | ICD-10-CM | POA: Diagnosis not present

## 2023-06-11 DIAGNOSIS — I10 Essential (primary) hypertension: Secondary | ICD-10-CM | POA: Diagnosis not present

## 2023-06-11 DIAGNOSIS — K649 Unspecified hemorrhoids: Secondary | ICD-10-CM | POA: Diagnosis not present

## 2023-06-11 DIAGNOSIS — E7800 Pure hypercholesterolemia, unspecified: Secondary | ICD-10-CM | POA: Diagnosis not present

## 2023-07-04 DIAGNOSIS — E1122 Type 2 diabetes mellitus with diabetic chronic kidney disease: Secondary | ICD-10-CM | POA: Diagnosis not present

## 2023-07-04 DIAGNOSIS — E782 Mixed hyperlipidemia: Secondary | ICD-10-CM | POA: Diagnosis not present

## 2023-07-04 DIAGNOSIS — I10 Essential (primary) hypertension: Secondary | ICD-10-CM | POA: Diagnosis not present

## 2023-07-04 DIAGNOSIS — E559 Vitamin D deficiency, unspecified: Secondary | ICD-10-CM | POA: Diagnosis not present

## 2023-07-17 DIAGNOSIS — M545 Low back pain, unspecified: Secondary | ICD-10-CM | POA: Diagnosis not present

## 2023-07-30 DIAGNOSIS — M109 Gout, unspecified: Secondary | ICD-10-CM | POA: Diagnosis not present

## 2023-07-30 DIAGNOSIS — M549 Dorsalgia, unspecified: Secondary | ICD-10-CM | POA: Diagnosis not present

## 2023-08-13 DIAGNOSIS — M5136 Other intervertebral disc degeneration, lumbar region with discogenic back pain only: Secondary | ICD-10-CM | POA: Diagnosis not present

## 2023-08-13 DIAGNOSIS — Z9889 Other specified postprocedural states: Secondary | ICD-10-CM | POA: Diagnosis not present

## 2023-08-13 DIAGNOSIS — M546 Pain in thoracic spine: Secondary | ICD-10-CM | POA: Diagnosis not present

## 2023-08-13 DIAGNOSIS — M5382 Other specified dorsopathies, cervical region: Secondary | ICD-10-CM | POA: Diagnosis not present

## 2023-08-13 DIAGNOSIS — Z85848 Personal history of malignant neoplasm of other parts of nervous tissue: Secondary | ICD-10-CM | POA: Diagnosis not present

## 2023-08-13 DIAGNOSIS — M9973 Connective tissue and disc stenosis of intervertebral foramina of lumbar region: Secondary | ICD-10-CM | POA: Diagnosis not present

## 2023-08-13 DIAGNOSIS — M48061 Spinal stenosis, lumbar region without neurogenic claudication: Secondary | ICD-10-CM | POA: Diagnosis not present

## 2023-08-13 DIAGNOSIS — M47816 Spondylosis without myelopathy or radiculopathy, lumbar region: Secondary | ICD-10-CM | POA: Diagnosis not present

## 2023-08-13 DIAGNOSIS — M4802 Spinal stenosis, cervical region: Secondary | ICD-10-CM | POA: Diagnosis not present

## 2023-09-02 DIAGNOSIS — M109 Gout, unspecified: Secondary | ICD-10-CM | POA: Diagnosis not present

## 2023-09-23 DIAGNOSIS — M10072 Idiopathic gout, left ankle and foot: Secondary | ICD-10-CM | POA: Diagnosis not present

## 2023-09-23 DIAGNOSIS — M79672 Pain in left foot: Secondary | ICD-10-CM | POA: Diagnosis not present

## 2023-09-24 DIAGNOSIS — R0602 Shortness of breath: Secondary | ICD-10-CM | POA: Diagnosis not present

## 2023-09-24 DIAGNOSIS — R790 Abnormal level of blood mineral: Secondary | ICD-10-CM | POA: Diagnosis not present

## 2023-09-26 DIAGNOSIS — M10072 Idiopathic gout, left ankle and foot: Secondary | ICD-10-CM | POA: Diagnosis not present

## 2023-09-26 DIAGNOSIS — M79672 Pain in left foot: Secondary | ICD-10-CM | POA: Diagnosis not present

## 2023-09-30 ENCOUNTER — Encounter: Payer: Self-pay | Admitting: Nurse Practitioner

## 2023-09-30 ENCOUNTER — Ambulatory Visit: Payer: Medicare Other | Admitting: Nurse Practitioner

## 2023-09-30 VITALS — BP 112/69 | HR 73 | Ht 71.0 in | Wt 301.4 lb

## 2023-09-30 DIAGNOSIS — E1122 Type 2 diabetes mellitus with diabetic chronic kidney disease: Secondary | ICD-10-CM | POA: Diagnosis not present

## 2023-09-30 DIAGNOSIS — Z7985 Long-term (current) use of injectable non-insulin antidiabetic drugs: Secondary | ICD-10-CM | POA: Diagnosis not present

## 2023-09-30 DIAGNOSIS — N1832 Chronic kidney disease, stage 3b: Secondary | ICD-10-CM | POA: Diagnosis not present

## 2023-09-30 DIAGNOSIS — I10 Essential (primary) hypertension: Secondary | ICD-10-CM

## 2023-09-30 DIAGNOSIS — E782 Mixed hyperlipidemia: Secondary | ICD-10-CM

## 2023-09-30 NOTE — Progress Notes (Signed)
Endocrinology Follow Up Note       09/30/2023, 11:13 AM   Subjective:    Patient ID: Adam Grant, male    DOB: 09/01/1956.  Adam Grant is being seen in follow up after being seen in consultation for management of currently uncontrolled symptomatic diabetes requested by  Garnette Gunner, MD.   Past Medical History:  Diagnosis Date   Diabetes mellitus without complication (HCC)    Hypertension    Obstructive sleep apnea     Past Surgical History:  Procedure Laterality Date   EYE SURGERY Bilateral    1978, 2001   SPINE SURGERY     Spine Cancer 2019    Social History   Socioeconomic History   Marital status: Divorced    Spouse name: Not on file   Number of children: 3   Years of education: Not on file   Highest education level: Not on file  Occupational History   Occupation: elect. tech  Tobacco Use   Smoking status: Never    Passive exposure: Past   Smokeless tobacco: Never  Vaping Use   Vaping status: Never Used  Substance and Sexual Activity   Alcohol use: Yes    Comment: ocass.    Drug use: No   Sexual activity: Not on file  Other Topics Concern   Not on file  Social History Narrative   Not on file   Social Drivers of Health   Financial Resource Strain: Not on file  Food Insecurity: Not on file  Transportation Needs: Not on file  Physical Activity: Not on file  Stress: Not on file  Social Connections: Not on file    Family History  Problem Relation Age of Onset   Cancer Mother    Hypertension Mother    Hyperlipidemia Mother    Hypertension Father    Cancer Father    Heart attack Father    Heart failure Father     Outpatient Encounter Medications as of 09/30/2023  Medication Sig   ACCU-CHEK GUIDE test strip Use to monitor glucose 4 times daily, before meals and before bed.   Accu-Chek Softclix Lancets lancets Use as instructed to monitor glucose 4 times daily    acetaminophen (TYLENOL) 325 MG tablet Take by mouth.   amLODipine (NORVASC) 10 MG tablet    aspirin EC 81 MG tablet Take by mouth.   atorvastatin (LIPITOR) 40 MG tablet Take by mouth.   Cholecalciferol (VITAMIN D-3 PO) Take 1,000 mg by mouth daily.   Cholecalciferol (VITAMIN D3 PO) Take by mouth.   labetalol (NORMODYNE) 100 MG tablet Take 200 mg by mouth 2 (two) times daily.   meloxicam (MOBIC) 15 MG tablet Take 15 mg by mouth daily.   Multiple Vitamins-Minerals (COMPLETE) TABS Take by mouth.   oxycodone (OXY-IR) 5 MG capsule Take 5 mg by mouth as needed.   Semaglutide, 2 MG/DOSE, 8 MG/3ML SOPN Inject 2 mg as directed once a week.   sulfamethoxazole-trimethoprim (BACTRIM DS) 800-160 MG tablet Take 1 tablet by mouth 2 (two) times daily.   tamsulosin (FLOMAX) 0.4 MG CAPS capsule Take by mouth.   hydrochlorothiazide (HYDRODIURIL) 25 MG tablet Take by mouth.   No  facility-administered encounter medications on file as of 09/30/2023.    ALLERGIES: Allergies  Allergen Reactions   Farxiga [Dapagliflozin] Rash   Losartan Swelling, Palpitations and Rash   Penicillins Itching and Rash   Tetracyclines & Related Rash    VACCINATION STATUS: Immunization History  Administered Date(s) Administered   PFIZER Comirnaty(Gray Top)Covid-19 Tri-Sucrose Vaccine 10/24/2019, 11/15/2019, 07/20/2020    Diabetes He presents for his follow-up diabetic visit. He has type 2 diabetes mellitus. Onset time: Diagnosed at approx age of 2. His disease course has been stable. There are no hypoglycemic associated symptoms. Associated symptoms include fatigue. Pertinent negatives for diabetes include no blurred vision, no foot ulcerations, no polydipsia and no polyuria. There are no hypoglycemic complications. Symptoms are stable. Diabetic complications include nephropathy and retinopathy. Risk factors for coronary artery disease include diabetes mellitus, dyslipidemia, family history, male sex, obesity, hypertension and  sedentary lifestyle. Current diabetic treatments: Ozempic only. He is compliant with treatment most of the time. His weight is fluctuating minimally. He is following a generally unhealthy diet. When asked about meal planning, he reported none. He has had a previous visit with a dietitian (has seen dietician in distant past). His home blood glucose trend is fluctuating minimally. His breakfast blood glucose range is generally 140-180 mg/dl. (He presents today with his logs showing slightly above target fasting glycemic profile.  His most recent A1c, checked by PCP on 1/21, was 6.6% unchanged last visit.  He denies any hypoglycemia.  He notes he still eats foods he knows he shouldn't (fried foods and diet sodas but is doing so less often).  He has been out of his Ozempic for 2 weeks now (gets this through PAP with his PCP).  He did have gout flare since last visit and was on prednisone temporarily.) An ACE inhibitor/angiotensin II receptor blocker is not being taken. He does not see a podiatrist.Eye exam is current.  Hyperlipidemia This is a chronic problem. The current episode started more than 1 year ago. Exacerbating diseases include chronic renal disease, diabetes and obesity. Factors aggravating his hyperlipidemia include thiazides and fatty foods. Current antihyperlipidemic treatment includes statins. Compliance problems include adherence to diet and adherence to exercise.  Risk factors for coronary artery disease include diabetes mellitus, dyslipidemia, family history, male sex, obesity, hypertension and a sedentary lifestyle.  Hypertension This is a chronic problem. The current episode started more than 1 year ago. The problem is unchanged. The problem is uncontrolled. Pertinent negatives include no blurred vision. There are no associated agents to hypertension. Risk factors for coronary artery disease include dyslipidemia, diabetes mellitus, family history, obesity, male gender and sedentary lifestyle.  Past treatments include diuretics and calcium channel blockers. The current treatment provides mild improvement. Compliance problems include diet and exercise.  Hypertensive end-organ damage includes kidney disease and retinopathy. Identifiable causes of hypertension include chronic renal disease and sleep apnea.     Review of systems  Constitutional: + stable body weight,  current Body mass index is 42.04 kg/m. , no fatigue, no subjective hyperthermia, no subjective hypothermia Eyes: no blurry vision, no xerophthalmia ENT: no sore throat, no nodules palpated in throat, no dysphagia/odynophagia, no hoarseness Cardiovascular: no chest pain, no shortness of breath, no palpitations, no leg swelling Respiratory: no cough, no shortness of breath Gastrointestinal: no nausea/vomiting/diarrhea Musculoskeletal: + generalized muscle aches- walks with cane Skin: no rashes, no hyperemia Neurological: no tremors, no numbness, no tingling, no dizziness Psychiatric: no depression, no anxiety  Objective:     BP 112/69 (BP Location:  Left Arm, Patient Position: Sitting, Cuff Size: Large)   Pulse 73   Ht 5\' 11"  (1.803 m)   Wt (!) 301 lb 6.4 oz (136.7 kg)   BMI 42.04 kg/m   Wt Readings from Last 3 Encounters:  09/30/23 (!) 301 lb 6.4 oz (136.7 kg)  05/28/23 (!) 306 lb 9.6 oz (139.1 kg)  01/23/23 (!) 323 lb 9.6 oz (146.8 kg)     BP Readings from Last 3 Encounters:  09/30/23 112/69  05/28/23 (!) 128/55  01/23/23 132/73     Physical Exam- Limited  Constitutional:  Body mass index is 42.04 kg/m. , not in acute distress, distracted state of mind with frequent interruptions Eyes:  EOMI, no exophthalmos Neck: Supple Musculoskeletal: no gross deformities, strength intact in all four extremities, no gross restriction of joint movements, walks with cane Skin:  no rashes, no hyperemia Neurological: no tremor with outstretched hands   Diabetic Foot Exam - Simple   No data filed     CMP ( most  recent) CMP     Component Value Date/Time   BUN 21 06/04/2023 0000   CREATININE 1.6 (A) 06/04/2023 0000   ALKPHOS 131 (A) 11/09/2020 0000   GFRNONAA 40 11/09/2020 0000     Diabetic Labs (most recent): Lab Results  Component Value Date   HGBA1C 6.6 06/04/2023   HGBA1C 6.6 (A) 05/28/2023   HGBA1C 6.9 (A) 01/23/2023     Lipid Panel ( most recent) Lipid Panel     Component Value Date/Time   TRIG 201 (A) 06/04/2023 0000   LDLCALC 66 06/04/2023 0000      Lab Results  Component Value Date   TSH 2.85 06/04/2023           Assessment & Plan:   1) Type 2 diabetes mellitus with stage 3b chronic kidney disease, without long-term current use of insulin (HCC)  He presents today with his logs showing slightly above target fasting glycemic profile.  His most recent A1c, checked by PCP on 1/21, was 6.6% unchanged last visit.  He denies any hypoglycemia.  He notes he still eats foods he knows he shouldn't (fried foods and diet sodas but is doing so less often).  He has been out of his Ozempic for 2 weeks now (gets this through PAP with his PCP).  He did have gout flare since last visit and was on prednisone temporarily.  - Adam Grant has currently uncontrolled symptomatic type 2 DM since 68 years of age.   -Recent labs reviewed.  - I had a long discussion with him about the progressive nature of diabetes and the pathology behind its complications. -his diabetes is complicated by nonhealing diabetic foot ulcer, PAD, CKD, retinopathy and he remains at a high risk for more acute and chronic complications which include CAD, CVA, CKD, retinopathy, and neuropathy. These are all discussed in detail with him.  - Nutritional counseling repeated at each appointment due to patients tendency to fall back in to old habits.  - The patient admits there is a room for improvement in their diet and drink choices. -  Suggestion is made for the patient to avoid simple carbohydrates from their diet  including Cakes, Sweet Desserts / Pastries, Ice Cream, Soda (diet and regular), Sweet Tea, Candies, Chips, Cookies, Sweet Pastries, Store Bought Juices, Alcohol in Excess of 1-2 drinks a day, Artificial Sweeteners, Coffee Creamer, and "Sugar-free" Products. This will help patient to have stable blood glucose profile and potentially avoid unintended weight gain.   -  I encouraged the patient to switch to unprocessed or minimally processed complex starch and increased protein intake (animal or plant source), fruits, and vegetables.   - Patient is advised to stick to a routine mealtimes to eat 3 meals a day and avoid unnecessary snacks (to snack only to correct hypoglycemia).  - I have approached him with the following individualized plan to manage his diabetes and patient agrees:   -He is advised to continue his Ozempic 2 mg SQ weekly (he gets patient assistance through his PCP for this medication).  I encouraged him to reach out to their office to inquire about his shipment since he has been out for 2 weeks now.  -He is encouraged to continue monitoring blood glucose twice daily, at breakfast and before bed, and to call the clinic if he has readings less than 70 or above 300 for 3 tests in a row.  - Adjustment parameters are given to him for hypo and hyperglycemia in writing.  - he is not a candidate for Metformin due to concurrent renal insufficiency.  - he did not tolerate Metformin, Rybelsus, Tradjenta, or Farxiga in the past.  - Specific targets for  A1c; LDL, HDL, and Triglycerides were discussed with the patient.  2) Blood Pressure /Hypertension:  his blood pressure is controlled to target.   he is advised to continue his current medications including Norvasc 10 mg p.o. daily with breakfast, and HCTZ 25 mg po daily.  3) Lipids/Hyperlipidemia:    His recent lipid panel from 06/04/23 shows controlled LDL of 66.  he is advised to continue Lipitor 40 mg daily at bedtime.  Side effects and  precautions discussed with him.    4)  Weight/Diet:  his Body mass index is 42.04 kg/m.  -  clearly complicating his diabetes care.   he is a candidate for weight loss. I discussed with him the fact that loss of 5 - 10% of his  current body weight will have the most impact on his diabetes management.  Exercise, and detailed carbohydrates information provided  -  detailed on discharge instructions.  5) Chronic Care/Health Maintenance: -he is not on ACEI/ARB and is on Statin medications and is encouraged to initiate and continue to follow up with Ophthalmology, Dentist, Podiatrist at least yearly or according to recommendations, and advised to stay away from smoking. I have recommended yearly flu vaccine and pneumonia vaccine at least every 5 years; moderate intensity exercise for up to 150 minutes weekly; and sleep for at least 7 hours a day.  - he is advised to maintain close follow up with Garnette Gunner, MD for primary care needs, as well as his other providers for optimal and coordinated care.    I spent  24  minutes in the care of the patient today including review of labs from CMP, Lipids, Thyroid Function, Hematology (current and previous including abstractions from other facilities); face-to-face time discussing  his blood glucose readings/logs, discussing hypoglycemia and hyperglycemia episodes and symptoms, medications doses, his options of short and long term treatment based on the latest standards of care / guidelines;  discussion about incorporating lifestyle medicine;  and documenting the encounter. Risk reduction counseling performed per USPSTF guidelines to reduce obesity and cardiovascular risk factors.     Please refer to Patient Instructions for Blood Glucose Monitoring and Insulin/Medications Dosing Guide"  in media tab for additional information. Please  also refer to " Patient Self Inventory" in the Media  tab for reviewed elements of pertinent  patient history.  Adam Grant  participated in the discussions, expressed understanding, and voiced agreement with the above plans.  All questions were answered to his satisfaction. he is encouraged to contact clinic should he have any questions or concerns prior to his return visit.     Follow up plan: - Return in about 4 months (around 01/28/2024) for Diabetes F/U with A1c in office, No previsit labs, Bring meter and logs.   Ronny Bacon, [facility] [address] [place], [place] [address] Phone: [phone] Fax: [phone]  09/30/2023, 11:13 AM

## 2023-10-17 DIAGNOSIS — M79675 Pain in left toe(s): Secondary | ICD-10-CM | POA: Diagnosis not present

## 2023-10-17 DIAGNOSIS — M25579 Pain in unspecified ankle and joints of unspecified foot: Secondary | ICD-10-CM | POA: Diagnosis not present

## 2023-10-17 DIAGNOSIS — M10071 Idiopathic gout, right ankle and foot: Secondary | ICD-10-CM | POA: Diagnosis not present

## 2023-10-17 DIAGNOSIS — M79672 Pain in left foot: Secondary | ICD-10-CM | POA: Diagnosis not present

## 2023-10-20 DIAGNOSIS — Z881 Allergy status to other antibiotic agents status: Secondary | ICD-10-CM | POA: Diagnosis not present

## 2023-10-20 DIAGNOSIS — E119 Type 2 diabetes mellitus without complications: Secondary | ICD-10-CM | POA: Diagnosis not present

## 2023-10-20 DIAGNOSIS — Z86718 Personal history of other venous thrombosis and embolism: Secondary | ICD-10-CM | POA: Diagnosis not present

## 2023-10-20 DIAGNOSIS — S61422A Laceration with foreign body of left hand, initial encounter: Secondary | ICD-10-CM | POA: Diagnosis not present

## 2023-10-20 DIAGNOSIS — I10 Essential (primary) hypertension: Secondary | ICD-10-CM | POA: Diagnosis not present

## 2023-10-20 DIAGNOSIS — Z888 Allergy status to other drugs, medicaments and biological substances status: Secondary | ICD-10-CM | POA: Diagnosis not present

## 2023-10-20 DIAGNOSIS — E7800 Pure hypercholesterolemia, unspecified: Secondary | ICD-10-CM | POA: Diagnosis not present

## 2023-10-20 DIAGNOSIS — M19042 Primary osteoarthritis, left hand: Secondary | ICD-10-CM | POA: Diagnosis not present

## 2023-10-20 DIAGNOSIS — S61412A Laceration without foreign body of left hand, initial encounter: Secondary | ICD-10-CM | POA: Diagnosis not present

## 2023-10-20 DIAGNOSIS — G4733 Obstructive sleep apnea (adult) (pediatric): Secondary | ICD-10-CM | POA: Diagnosis not present

## 2023-10-20 DIAGNOSIS — X58XXXA Exposure to other specified factors, initial encounter: Secondary | ICD-10-CM | POA: Diagnosis not present

## 2023-10-20 DIAGNOSIS — Z880 Allergy status to penicillin: Secondary | ICD-10-CM | POA: Diagnosis not present

## 2023-10-20 DIAGNOSIS — Z79899 Other long term (current) drug therapy: Secondary | ICD-10-CM | POA: Diagnosis not present

## 2023-11-07 DIAGNOSIS — M79671 Pain in right foot: Secondary | ICD-10-CM | POA: Diagnosis not present

## 2023-11-07 DIAGNOSIS — M10071 Idiopathic gout, right ankle and foot: Secondary | ICD-10-CM | POA: Diagnosis not present

## 2023-12-03 DIAGNOSIS — M79672 Pain in left foot: Secondary | ICD-10-CM | POA: Diagnosis not present

## 2023-12-03 DIAGNOSIS — M10072 Idiopathic gout, left ankle and foot: Secondary | ICD-10-CM | POA: Diagnosis not present

## 2024-01-01 DIAGNOSIS — I10 Essential (primary) hypertension: Secondary | ICD-10-CM | POA: Diagnosis not present

## 2024-01-01 DIAGNOSIS — E1122 Type 2 diabetes mellitus with diabetic chronic kidney disease: Secondary | ICD-10-CM | POA: Diagnosis not present

## 2024-01-16 DIAGNOSIS — E1140 Type 2 diabetes mellitus with diabetic neuropathy, unspecified: Secondary | ICD-10-CM | POA: Diagnosis not present

## 2024-01-16 DIAGNOSIS — M79674 Pain in right toe(s): Secondary | ICD-10-CM | POA: Diagnosis not present

## 2024-01-16 DIAGNOSIS — I739 Peripheral vascular disease, unspecified: Secondary | ICD-10-CM | POA: Diagnosis not present

## 2024-01-16 DIAGNOSIS — L110 Acquired keratosis follicularis: Secondary | ICD-10-CM | POA: Diagnosis not present

## 2024-01-16 DIAGNOSIS — M79672 Pain in left foot: Secondary | ICD-10-CM | POA: Diagnosis not present

## 2024-01-16 DIAGNOSIS — M79671 Pain in right foot: Secondary | ICD-10-CM | POA: Diagnosis not present

## 2024-01-16 DIAGNOSIS — M79675 Pain in left toe(s): Secondary | ICD-10-CM | POA: Diagnosis not present

## 2024-01-28 ENCOUNTER — Ambulatory Visit: Payer: Medicare Other | Admitting: Nurse Practitioner

## 2024-01-28 ENCOUNTER — Encounter: Payer: Self-pay | Admitting: Nurse Practitioner

## 2024-01-28 VITALS — BP 120/70 | HR 65 | Ht 71.0 in | Wt 303.6 lb

## 2024-01-28 DIAGNOSIS — I10 Essential (primary) hypertension: Secondary | ICD-10-CM

## 2024-01-28 DIAGNOSIS — Z7985 Long-term (current) use of injectable non-insulin antidiabetic drugs: Secondary | ICD-10-CM

## 2024-01-28 DIAGNOSIS — N1832 Chronic kidney disease, stage 3b: Secondary | ICD-10-CM

## 2024-01-28 DIAGNOSIS — E1122 Type 2 diabetes mellitus with diabetic chronic kidney disease: Secondary | ICD-10-CM

## 2024-01-28 DIAGNOSIS — E782 Mixed hyperlipidemia: Secondary | ICD-10-CM | POA: Diagnosis not present

## 2024-01-28 LAB — POCT GLYCOSYLATED HEMOGLOBIN (HGB A1C): Hemoglobin A1C: 6.2 % — AB (ref 4.0–5.6)

## 2024-01-28 NOTE — Progress Notes (Signed)
 Endocrinology Follow Up Note       01/28/2024, 9:46 AM   Subjective:    Patient ID: Adam Grant, male    DOB: [birth year].  Adam Grant is being seen in follow up after being seen in consultation for management of currently uncontrolled symptomatic diabetes requested by  Lauran Pollard, MD.   Past Medical History:  Diagnosis Date   Diabetes mellitus without complication (HCC)    Hypertension    Obstructive sleep apnea     Past Surgical History:  Procedure Laterality Date   EYE SURGERY Bilateral    1978, 2001   SPINE SURGERY     Spine Cancer 2019    Social History   Socioeconomic History   Marital status: Divorced    Spouse name: Not on file   Number of children: 3   Years of education: Not on file   Highest education level: Not on file  Occupational History   Occupation: elect. tech  Tobacco Use   Smoking status: Never    Passive exposure: Past   Smokeless tobacco: Never  Vaping Use   Vaping status: Never Used  Substance and Sexual Activity   Alcohol use: Yes    Comment: ocass.    Drug use: No   Sexual activity: Not on file  Other Topics Concern   Not on file  Social History Narrative   Not on file   Social Drivers of Health   Financial Resource Strain: Not on file  Food Insecurity: Not on file  Transportation Needs: Not on file  Physical Activity: Not on file  Stress: Not on file  Social Connections: Not on file    Family History  Problem Relation Age of Onset   Cancer Mother    Hypertension Mother    Hyperlipidemia Mother    Hypertension Father    Cancer Father    Heart attack Father    Heart failure Father     Outpatient Encounter Medications as of 01/28/2024  Medication Sig   acetaminophen (TYLENOL) 325 MG tablet Take by mouth.   amLODipine (NORVASC) 10 MG tablet    aspirin EC 81 MG tablet Take by mouth.   atorvastatin (LIPITOR) 40 MG tablet Take by mouth.    Cholecalciferol (VITAMIN D-3 PO) Take 1,000 mg by mouth daily.   Cholecalciferol (VITAMIN D3 PO) Take by mouth.   labetalol (NORMODYNE) 100 MG tablet Take 200 mg by mouth 2 (two) times daily.   meloxicam  (MOBIC ) 15 MG tablet Take 15 mg by mouth daily.   Multiple Vitamins-Minerals (COMPLETE) TABS Take by mouth.   oxycodone (OXY-IR) 5 MG capsule Take 5 mg by mouth as needed.   Semaglutide , 2 MG/DOSE, 8 MG/3ML SOPN Inject 2 mg as directed once a week.   sulfamethoxazole -trimethoprim  (BACTRIM  DS) 800-160 MG tablet Take 1 tablet by mouth 2 (two) times daily.   tamsulosin (FLOMAX) 0.4 MG CAPS capsule Take by mouth.   ACCU-CHEK GUIDE test strip Use to monitor glucose 4 times daily, before meals and before bed.   Accu-Chek Softclix Lancets lancets Use as instructed to monitor glucose 4 times daily   hydrochlorothiazide (HYDRODIURIL) 25 MG tablet Take by mouth.   No  facility-administered encounter medications on file as of 01/28/2024.    ALLERGIES: Allergies  Allergen Reactions   Farxiga [Dapagliflozin] Rash   Losartan Swelling, Palpitations and Rash   Penicillins Itching and Rash   Tetracyclines & Related Rash    VACCINATION STATUS: Immunization History  Administered Date(s) Administered   PFIZER Comirnaty(Gray Top)Covid-19 Tri-Sucrose Vaccine 10/24/2019, 11/15/2019, 07/20/2020    Diabetes He presents for his follow-up diabetic visit. He has type 2 diabetes mellitus. Onset time: Diagnosed at approx age of 39. His disease course has been stable. There are no hypoglycemic associated symptoms. Associated symptoms include fatigue. Pertinent negatives for diabetes include no blurred vision, no foot ulcerations, no polydipsia and no polyuria. There are no hypoglycemic complications. Symptoms are stable. Diabetic complications include nephropathy and retinopathy. Risk factors for coronary artery disease include diabetes mellitus, dyslipidemia, family history, male sex, obesity, hypertension and  sedentary lifestyle. Current diabetic treatments: Ozempic  only. He is compliant with treatment most of the time. His weight is fluctuating minimally. He is following a generally unhealthy diet. When asked about meal planning, he reported none. He has had a previous visit with a dietitian (has seen dietician in distant past). His home blood glucose trend is fluctuating minimally. His breakfast blood glucose range is generally 140-180 mg/dl. (He presents today with his logs showing slightly above target fasting glycemic profile.  His POCT A1c today is 6.2%, improving from last visit of 6.6%.  He denies any hypoglycemia.  He notes he still eats foods he knows he shouldn't (fried foods and diet sodas but is doing so less often). He does get his Ozempic  from patient assistance through his PCP.) An ACE inhibitor/angiotensin II receptor blocker is not being taken. He does not see a podiatrist.Eye exam is current.  Hyperlipidemia This is a chronic problem. The current episode started more than 1 year ago. Exacerbating diseases include chronic renal disease, diabetes and obesity. Factors aggravating his hyperlipidemia include thiazides and fatty foods. Current antihyperlipidemic treatment includes statins. Compliance problems include adherence to diet and adherence to exercise.  Risk factors for coronary artery disease include diabetes mellitus, dyslipidemia, family history, male sex, obesity, hypertension and a sedentary lifestyle.  Hypertension This is a chronic problem. The current episode started more than 1 year ago. The problem is unchanged. The problem is uncontrolled. Pertinent negatives include no blurred vision. There are no associated agents to hypertension. Risk factors for coronary artery disease include dyslipidemia, diabetes mellitus, family history, obesity, male gender and sedentary lifestyle. Past treatments include diuretics and calcium channel blockers. The current treatment provides mild improvement.  Compliance problems include diet and exercise.  Hypertensive end-organ damage includes kidney disease and retinopathy. Identifiable causes of hypertension include chronic renal disease and sleep apnea.     Review of systems  Constitutional: + stable body weight,  current Body mass index is 42.34 kg/m. , no fatigue, no subjective hyperthermia, no subjective hypothermia Eyes: no blurry vision, no xerophthalmia ENT: no sore throat, no nodules palpated in throat, no dysphagia/odynophagia, no hoarseness Cardiovascular: no chest pain, no shortness of breath, no palpitations, no leg swelling Respiratory: no cough, no shortness of breath Gastrointestinal: no nausea/vomiting/diarrhea Musculoskeletal: + generalized muscle aches- walks with cane Skin: no rashes, no hyperemia Neurological: no tremors, no numbness, no tingling, no dizziness Psychiatric: no depression, no anxiety  Objective:     BP 120/70 (BP Location: Left Arm, Patient Position: Sitting, Cuff Size: Large)   Pulse 65   Ht 5\' 11"  (1.803 m)   Wt (!) 303  lb 9.6 oz (137.7 kg)   BMI 42.34 kg/m   Wt Readings from Last 3 Encounters:  01/28/24 (!) 303 lb 9.6 oz (137.7 kg)  09/30/23 (!) 301 lb 6.4 oz (136.7 kg)  05/28/23 (!) 306 lb 9.6 oz (139.1 kg)     BP Readings from Last 3 Encounters:  01/28/24 120/70  09/30/23 112/69  05/28/23 (!) 128/55     Physical Exam- Limited  Constitutional:  Body mass index is 42.34 kg/m. , not in acute distress, distracted state of mind with frequent interruptions Eyes:  EOMI, no exophthalmos Neck: Supple Musculoskeletal: no gross deformities, strength intact in all four extremities, no gross restriction of joint movements, walks with cane but forgot it today-very unsteady on his feet today Skin:  no rashes, no hyperemia Neurological: no tremor with outstretched hands   Diabetic Foot Exam - Simple   No data filed     CMP ( most recent) CMP     Component Value Date/Time   BUN 21  06/04/2023 0000   CREATININE 1.6 (A) 06/04/2023 0000   ALKPHOS 131 (A) 11/09/2020 0000   GFRNONAA 40 11/09/2020 0000     Diabetic Labs (most recent): Lab Results  Component Value Date   HGBA1C 6.2 (A) 01/28/2024   HGBA1C 6.6 06/04/2023   HGBA1C 6.6 (A) 05/28/2023     Lipid Panel ( most recent) Lipid Panel     Component Value Date/Time   TRIG 201 (A) 06/04/2023 0000   LDLCALC 66 06/04/2023 0000      Lab Results  Component Value Date   TSH 2.85 06/04/2023           Assessment & Plan:   1) Type 2 diabetes mellitus with stage 3b chronic kidney disease, without long-term current use of insulin (HCC)  He presents today with his logs showing slightly above target fasting glycemic profile.  His POCT A1c today is 6.2%, improving from last visit of 6.6%.  He denies any hypoglycemia.  He notes he still eats foods he knows he shouldn't (fried foods and diet sodas but is doing so less often). He does get his Ozempic  from patient assistance through his PCP.  - Manda Seats has currently uncontrolled symptomatic type 2 DM since 68 years of age.   -Recent labs reviewed.  - I had a long discussion with him about the progressive nature of diabetes and the pathology behind its complications. -his diabetes is complicated by nonhealing diabetic foot ulcer, PAD, CKD, retinopathy and he remains at a high risk for more acute and chronic complications which include CAD, CVA, CKD, retinopathy, and neuropathy. These are all discussed in detail with him.  - Nutritional counseling repeated at each appointment due to patients tendency to fall back in to old habits.  - The patient admits there is a room for improvement in their diet and drink choices. -  Suggestion is made for the patient to avoid simple carbohydrates from their diet including Cakes, Sweet Desserts / Pastries, Ice Cream, Soda (diet and regular), Sweet Tea, Candies, Chips, Cookies, Sweet Pastries, Store Bought Juices, Alcohol in  Excess of 1-2 drinks a day, Artificial Sweeteners, Coffee Creamer, and "Sugar-free" Products. This will help patient to have stable blood glucose profile and potentially avoid unintended weight gain.   - I encouraged the patient to switch to unprocessed or minimally processed complex starch and increased protein intake (animal or plant source), fruits, and vegetables.   - Patient is advised to stick to a routine mealtimes to  eat 3 meals a day and avoid unnecessary snacks (to snack only to correct hypoglycemia).  - I have approached him with the following individualized plan to manage his diabetes and patient agrees:   -He is advised to continue his Ozempic  2 mg SQ weekly (he gets patient assistance through his PCP for this medication).    -He is encouraged to continue monitoring blood glucose once daily, at breakfast, and to call the clinic if he has readings less than 70 or above 300 for 3 tests in a row.  - Adjustment parameters are given to him for hypo and hyperglycemia in writing.  - he is not a candidate for Metformin due to concurrent renal insufficiency.  - he did not tolerate Metformin, Rybelsus , Tradjenta, or Farxiga in the past.  - Specific targets for  A1c; LDL, HDL, and Triglycerides were discussed with the patient.  2) Blood Pressure /Hypertension:  his blood pressure is controlled to target.   he is advised to continue his current medications as prescribed by his PCP.  3) Lipids/Hyperlipidemia:    His recent lipid panel from 06/04/23 shows controlled LDL of 66.  he is advised to continue Lipitor 40 mg daily at bedtime.  Side effects and precautions discussed with him.  He has annual physical with labs coming up with PCP in November.  I asked that he have labs sent to use for record keeping.  4)  Weight/Diet:  his Body mass index is 42.34 kg/m.  -  clearly complicating his diabetes care.   he is a candidate for weight loss. I discussed with him the fact that loss of 5 - 10%  of his  current body weight will have the most impact on his diabetes management.  Exercise, and detailed carbohydrates information provided  -  detailed on discharge instructions.  5) Chronic Care/Health Maintenance: -he is not on ACEI/ARB and is on Statin medications and is encouraged to initiate and continue to follow up with Ophthalmology, Dentist, Podiatrist at least yearly or according to recommendations, and advised to stay away from smoking. I have recommended yearly flu vaccine and pneumonia vaccine at least every 5 years; moderate intensity exercise for up to 150 minutes weekly; and sleep for at least 7 hours a day.  - he is advised to maintain close follow up with Lauran Pollard, MD for primary care needs, as well as his other providers for optimal and coordinated care.      I spent  33  minutes in the care of the patient today including review of labs from CMP, Lipids, Thyroid  Function, Hematology (current and previous including abstractions from other facilities); face-to-face time discussing  his blood glucose readings/logs, discussing hypoglycemia and hyperglycemia episodes and symptoms, medications doses, his options of short and long term treatment based on the latest standards of care / guidelines;  discussion about incorporating lifestyle medicine;  and documenting the encounter. Risk reduction counseling performed per USPSTF guidelines to reduce obesity and cardiovascular risk factors.     Please refer to Patient Instructions for Blood Glucose Monitoring and Insulin/Medications Dosing Guide"  in media tab for additional information. Please  also refer to " Patient Self Inventory" in the Media  tab for reviewed elements of pertinent patient history.  Manda Seats participated in the discussions, expressed understanding, and voiced agreement with the above plans.  All questions were answered to his satisfaction. he is encouraged to contact clinic should he have any questions or  concerns prior to his return  visit.     Follow up plan: - Return in about 6 months (around 07/30/2024) for Diabetes F/U with A1c in office, No previsit labs.   Hulon Magic, [facility] [address] [place], [place] [address] Phone: [phone] Fax: [phone]  01/28/2024, 9:46 AM

## 2024-01-31 DIAGNOSIS — I10 Essential (primary) hypertension: Secondary | ICD-10-CM | POA: Diagnosis not present

## 2024-01-31 DIAGNOSIS — E1122 Type 2 diabetes mellitus with diabetic chronic kidney disease: Secondary | ICD-10-CM | POA: Diagnosis not present

## 2024-03-02 DIAGNOSIS — I10 Essential (primary) hypertension: Secondary | ICD-10-CM | POA: Diagnosis not present

## 2024-03-02 DIAGNOSIS — E1122 Type 2 diabetes mellitus with diabetic chronic kidney disease: Secondary | ICD-10-CM | POA: Diagnosis not present

## 2024-03-16 DIAGNOSIS — Z880 Allergy status to penicillin: Secondary | ICD-10-CM | POA: Diagnosis not present

## 2024-03-16 DIAGNOSIS — J449 Chronic obstructive pulmonary disease, unspecified: Secondary | ICD-10-CM | POA: Diagnosis not present

## 2024-03-16 DIAGNOSIS — W1839XA Other fall on same level, initial encounter: Secondary | ICD-10-CM | POA: Diagnosis not present

## 2024-03-16 DIAGNOSIS — R609 Edema, unspecified: Secondary | ICD-10-CM | POA: Diagnosis not present

## 2024-03-16 DIAGNOSIS — E7800 Pure hypercholesterolemia, unspecified: Secondary | ICD-10-CM | POA: Diagnosis not present

## 2024-03-16 DIAGNOSIS — I10 Essential (primary) hypertension: Secondary | ICD-10-CM | POA: Diagnosis not present

## 2024-03-16 DIAGNOSIS — W19XXXA Unspecified fall, initial encounter: Secondary | ICD-10-CM | POA: Diagnosis not present

## 2024-03-16 DIAGNOSIS — E119 Type 2 diabetes mellitus without complications: Secondary | ICD-10-CM | POA: Diagnosis not present

## 2024-03-16 DIAGNOSIS — Z86718 Personal history of other venous thrombosis and embolism: Secondary | ICD-10-CM | POA: Diagnosis not present

## 2024-03-16 DIAGNOSIS — S82401A Unspecified fracture of shaft of right fibula, initial encounter for closed fracture: Secondary | ICD-10-CM | POA: Diagnosis not present

## 2024-03-16 DIAGNOSIS — S82431A Displaced oblique fracture of shaft of right fibula, initial encounter for closed fracture: Secondary | ICD-10-CM | POA: Diagnosis not present

## 2024-03-16 DIAGNOSIS — S82831A Other fracture of upper and lower end of right fibula, initial encounter for closed fracture: Secondary | ICD-10-CM | POA: Diagnosis not present

## 2024-03-20 DIAGNOSIS — S82831A Other fracture of upper and lower end of right fibula, initial encounter for closed fracture: Secondary | ICD-10-CM | POA: Diagnosis not present

## 2024-03-20 DIAGNOSIS — M25571 Pain in right ankle and joints of right foot: Secondary | ICD-10-CM | POA: Diagnosis not present

## 2024-04-02 DIAGNOSIS — I10 Essential (primary) hypertension: Secondary | ICD-10-CM | POA: Diagnosis not present

## 2024-04-02 DIAGNOSIS — S82831A Other fracture of upper and lower end of right fibula, initial encounter for closed fracture: Secondary | ICD-10-CM | POA: Diagnosis not present

## 2024-04-02 DIAGNOSIS — Z91199 Patient's noncompliance with other medical treatment and regimen due to unspecified reason: Secondary | ICD-10-CM | POA: Diagnosis not present

## 2024-04-02 DIAGNOSIS — E1122 Type 2 diabetes mellitus with diabetic chronic kidney disease: Secondary | ICD-10-CM | POA: Diagnosis not present

## 2024-04-02 DIAGNOSIS — M25571 Pain in right ankle and joints of right foot: Secondary | ICD-10-CM | POA: Diagnosis not present

## 2024-04-16 DIAGNOSIS — S82831A Other fracture of upper and lower end of right fibula, initial encounter for closed fracture: Secondary | ICD-10-CM | POA: Diagnosis not present

## 2024-04-29 DIAGNOSIS — M79672 Pain in left foot: Secondary | ICD-10-CM | POA: Diagnosis not present

## 2024-04-29 DIAGNOSIS — E1140 Type 2 diabetes mellitus with diabetic neuropathy, unspecified: Secondary | ICD-10-CM | POA: Diagnosis not present

## 2024-04-29 DIAGNOSIS — M79674 Pain in right toe(s): Secondary | ICD-10-CM | POA: Diagnosis not present

## 2024-04-29 DIAGNOSIS — M79675 Pain in left toe(s): Secondary | ICD-10-CM | POA: Diagnosis not present

## 2024-04-29 DIAGNOSIS — M79671 Pain in right foot: Secondary | ICD-10-CM | POA: Diagnosis not present

## 2024-04-29 DIAGNOSIS — I739 Peripheral vascular disease, unspecified: Secondary | ICD-10-CM | POA: Diagnosis not present

## 2024-04-29 DIAGNOSIS — L110 Acquired keratosis follicularis: Secondary | ICD-10-CM | POA: Diagnosis not present

## 2024-05-01 DIAGNOSIS — E1122 Type 2 diabetes mellitus with diabetic chronic kidney disease: Secondary | ICD-10-CM | POA: Diagnosis not present

## 2024-05-01 DIAGNOSIS — I10 Essential (primary) hypertension: Secondary | ICD-10-CM | POA: Diagnosis not present

## 2024-05-21 DIAGNOSIS — S82831D Other fracture of upper and lower end of right fibula, subsequent encounter for closed fracture with routine healing: Secondary | ICD-10-CM | POA: Diagnosis not present

## 2024-06-02 DIAGNOSIS — M4802 Spinal stenosis, cervical region: Secondary | ICD-10-CM | POA: Diagnosis not present

## 2024-06-02 DIAGNOSIS — R937 Abnormal findings on diagnostic imaging of other parts of musculoskeletal system: Secondary | ICD-10-CM | POA: Diagnosis not present

## 2024-06-02 DIAGNOSIS — G9589 Other specified diseases of spinal cord: Secondary | ICD-10-CM | POA: Diagnosis not present

## 2024-06-02 DIAGNOSIS — I10 Essential (primary) hypertension: Secondary | ICD-10-CM | POA: Diagnosis not present

## 2024-06-02 DIAGNOSIS — C720 Malignant neoplasm of spinal cord: Secondary | ICD-10-CM | POA: Diagnosis not present

## 2024-06-02 DIAGNOSIS — E1122 Type 2 diabetes mellitus with diabetic chronic kidney disease: Secondary | ICD-10-CM | POA: Diagnosis not present

## 2024-06-02 DIAGNOSIS — M5031 Other cervical disc degeneration,  high cervical region: Secondary | ICD-10-CM | POA: Diagnosis not present

## 2024-06-02 DIAGNOSIS — M47812 Spondylosis without myelopathy or radiculopathy, cervical region: Secondary | ICD-10-CM | POA: Diagnosis not present

## 2024-06-05 DIAGNOSIS — E559 Vitamin D deficiency, unspecified: Secondary | ICD-10-CM | POA: Diagnosis not present

## 2024-06-05 DIAGNOSIS — E1169 Type 2 diabetes mellitus with other specified complication: Secondary | ICD-10-CM | POA: Diagnosis not present

## 2024-06-05 DIAGNOSIS — N183 Chronic kidney disease, stage 3 unspecified: Secondary | ICD-10-CM | POA: Diagnosis not present

## 2024-06-05 DIAGNOSIS — Z130 Encounter for screening for diseases of the blood and blood-forming organs and certain disorders involving the immune mechanism: Secondary | ICD-10-CM | POA: Diagnosis not present

## 2024-06-05 DIAGNOSIS — E785 Hyperlipidemia, unspecified: Secondary | ICD-10-CM | POA: Diagnosis not present

## 2024-06-05 DIAGNOSIS — E039 Hypothyroidism, unspecified: Secondary | ICD-10-CM | POA: Diagnosis not present

## 2024-06-09 DIAGNOSIS — C720 Malignant neoplasm of spinal cord: Secondary | ICD-10-CM | POA: Diagnosis not present

## 2024-06-18 ENCOUNTER — Encounter (INDEPENDENT_AMBULATORY_CARE_PROVIDER_SITE_OTHER): Payer: Self-pay | Admitting: *Deleted

## 2024-07-06 ENCOUNTER — Telehealth: Payer: Self-pay

## 2024-07-06 NOTE — Telephone Encounter (Signed)
 Who is your primary care physician: Vaughn Pouch  Reasons for the colonoscopy: hx polyps  Have you had a colonoscopy before?  Yes 2005  Do you have family history of colon cancer? no  Previous colonoscopy with polyps removed? yes  Do you have a history colorectal cancer?   no  Are you diabetic? If yes, Type 1 or Type 2?    Yes type 2  Do you have a prosthetic or mechanical heart valve? no  Do you have a pacemaker/defibrillator?   no  Have you had endocarditis/atrial fibrillation? no  Have you had joint replacement within the last 12 months?  no  Do you tend to be constipated or have to use laxatives? Yes sometimes  Do you have any history of drugs or alchohol?  no  Do you use supplemental oxygen?  no  Have you had a stroke or heart attack within the last 6 months? no  Do you take weight loss medication?  yes    Do you take any blood-thinning medications such as: (aspirin, warfarin, Plavix, Aggrenox)  yes  If yes we need the name, milligram, dosage and who is prescribing doctor ASA 81 mg Current Outpatient Medications on File Prior to Visit  Medication Sig Dispense Refill   ACCU-CHEK GUIDE test strip Use to monitor glucose 4 times daily, before meals and before bed. 200 strip 3   Accu-Chek Softclix Lancets lancets Use as instructed to monitor glucose 4 times daily 100 each 12   acetaminophen (TYLENOL) 325 MG tablet Take by mouth.     amLODipine (NORVASC) 10 MG tablet      aspirin EC 81 MG tablet Take by mouth.     atorvastatin (LIPITOR) 40 MG tablet Take by mouth.     Cholecalciferol (VITAMIN D-3 PO) Take 1,000 mg by mouth daily.     hydrochlorothiazide (HYDRODIURIL) 25 MG tablet Take by mouth.     labetalol (NORMODYNE) 100 MG tablet Take 200 mg by mouth 2 (two) times daily.     meloxicam  (MOBIC ) 15 MG tablet Take 15 mg by mouth daily.     Multiple Vitamins-Minerals (COMPLETE) TABS Take by mouth.     oxycodone (OXY-IR) 5 MG capsule Take 5 mg by mouth as needed.      Semaglutide , 2 MG/DOSE, 8 MG/3ML SOPN Inject 2 mg as directed once a week. 3 mL 3   sulfamethoxazole -trimethoprim  (BACTRIM  DS) 800-160 MG tablet Take 1 tablet by mouth 2 (two) times daily.     tamsulosin (FLOMAX) 0.4 MG CAPS capsule Take by mouth.     No current facility-administered medications on file prior to visit.    Allergies  Allergen Reactions   Farxiga [Dapagliflozin] Rash   Losartan Swelling, Palpitations and Rash   Peanut-Containing Drug Products Hives   Penicillins Itching and Rash   Tetracyclines & Related Rash     Ph[facility]acy: [facility]  Primary Insurance Name: [facility] [id number]  Best number where you can be reached: [phone]

## 2024-07-08 NOTE — Telephone Encounter (Signed)
LMOVM to call back to schedule 

## 2024-07-20 ENCOUNTER — Encounter (INDEPENDENT_AMBULATORY_CARE_PROVIDER_SITE_OTHER): Payer: Self-pay | Admitting: *Deleted

## 2024-07-20 MED ORDER — PEG 3350-KCL-NA BICARB-NACL 420 G PO SOLR: 4000.0000 mL | Freq: Once | ORAL | 0 refills | Status: AC

## 2024-07-20 NOTE — Telephone Encounter (Signed)
 Pt called in. He has been scheduled for 12/5. Aware he will get a pre-op phone call prior to procedure with arrival time. Will mail instructions and send rx for prep to his pharmacy. Reports he takes his semaglutide  on Mondays, aware to no take the Monday before procedure.

## 2024-07-20 NOTE — Addendum Note (Signed)
 Addended by: JEANELL GRAEME RAMAN on: 07/20/2024 11:33 AM   Modules accepted: Orders

## 2024-07-20 NOTE — Telephone Encounter (Signed)
 Referral completed, [facility] apt letter sent to PCP

## 2024-08-04 ENCOUNTER — Ambulatory Visit: Admitting: Nurse Practitioner

## 2024-08-04 ENCOUNTER — Encounter (HOSPITAL_COMMUNITY)
Admission: RE | Admit: 2024-08-04 | Discharge: 2024-08-04 | Disposition: A | Discharge status: Home / Self Care | Source: Ambulatory Visit | Attending: Gastroenterology | Admitting: Gastroenterology

## 2024-08-04 ENCOUNTER — Encounter: Payer: Self-pay | Admitting: Nurse Practitioner

## 2024-08-04 VITALS — BP 108/78 | HR 71 | Ht 71.0 in | Wt 300.6 lb

## 2024-08-04 DIAGNOSIS — I10 Essential (primary) hypertension: Secondary | ICD-10-CM

## 2024-08-04 DIAGNOSIS — E782 Mixed hyperlipidemia: Secondary | ICD-10-CM

## 2024-08-04 DIAGNOSIS — E1122 Type 2 diabetes mellitus with diabetic chronic kidney disease: Secondary | ICD-10-CM | POA: Diagnosis not present

## 2024-08-04 DIAGNOSIS — Z7985 Long-term (current) use of injectable non-insulin antidiabetic drugs: Secondary | ICD-10-CM | POA: Diagnosis not present

## 2024-08-04 DIAGNOSIS — N1832 Chronic kidney disease, stage 3b: Secondary | ICD-10-CM

## 2024-08-04 LAB — POCT GLYCOSYLATED HEMOGLOBIN (HGB A1C): Hemoglobin A1C: 6.5 % — AB (ref 4.0–5.6)

## 2024-08-04 NOTE — Pre-Procedure Instructions (Signed)
 Attempted pre-op phone call. Left VM for him to call us back.

## 2024-08-04 NOTE — Progress Notes (Signed)
 Endocrinology Follow Up Note       08/04/2024, 10:30 AM   Subjective:    Patient ID: Adam Grant, male    DOB: 01/28/1956.  Adam Grant is being seen in follow up after being seen in consultation for management of currently uncontrolled symptomatic diabetes requested by  Trudy Vaughn FALCON, MD.   Past Medical History:  Diagnosis Date   Diabetes mellitus without complication (HCC)    Hypertension    Obstructive sleep apnea     Past Surgical History:  Procedure Laterality Date   EYE SURGERY Bilateral    1978, 2001   SPINE SURGERY     Spine Cancer 2019    Social History   Socioeconomic History   Marital status: Divorced    Spouse name: Not on file   Number of children: 3   Years of education: Not on file   Highest education level: Not on file  Occupational History   Occupation: elect. tech  Tobacco Use   Smoking status: Never    Passive exposure: Past   Smokeless tobacco: Never  Vaping Use   Vaping status: Never Used  Substance and Sexual Activity   Alcohol use: Yes    Comment: ocass.    Drug use: No   Sexual activity: Not on file  Other Topics Concern   Not on file  Social History Narrative   Not on file   Social Drivers of Health   Financial Resource Strain: Low Risk (03/16/2024)   Received from [facility]   Overall Financial Resource Strain (CARDIA)    How hard is it for you to pay for the very basics like food, housing, medical care, and heating?: Not very hard  Food Insecurity: No Food Insecurity (03/16/2024)   Received from [facility]   Hunger Vital Sign    Within the past 12 months, you worried that your food would run out before you got the money to buy more.: Never true    Within the past 12 months, the food you bought just didn't last and you didn't have money to get more.: Never true  Transportation Needs: No Transportation Needs (03/16/2024)   Received from [facility]   PRAPARE - Transportation    Lack of Transportation (Medical): No    Lack of Transportation (Non-Medical): No  Physical Activity: Not on file  Stress: Not on file  Social Connections: Not on file    Family History  Problem Relation Age of Onset   Cancer Mother    Hypertension Mother    Hyperlipidemia Mother    Hypertension Father    Cancer Father    Heart attack Father    Heart failure Father     Outpatient Encounter Medications as of 08/04/2024  Medication Sig   ACCU-CHEK GUIDE test strip Use to monitor glucose 4 times daily, before meals and before bed.   Accu-Chek Softclix Lancets lancets Use as instructed to monitor glucose 4 times daily   acetaminophen (TYLENOL) 325 MG tablet Take by mouth.   amLODipine (NORVASC) 10 MG tablet    aspirin EC 81 MG tablet Take by mouth.   atorvastatin (LIPITOR) 40 MG tablet Take  by mouth.   Cholecalciferol (VITAMIN D-3 PO) Take 1,000 mg by mouth daily.   fluticasone (FLONASE) 50 MCG/ACT nasal spray Place 1 spray into both nostrils daily.   hydrochlorothiazide (HYDRODIURIL) 25 MG tablet Take by mouth.   labetalol (NORMODYNE) 100 MG tablet Take 200 mg by mouth 2 (two) times daily.   Multiple Vitamins-Minerals (COMPLETE) TABS Take by mouth.   oxycodone (OXY-IR) 5 MG capsule Take 5 mg by mouth as needed.   Semaglutide , 2 MG/DOSE, 8 MG/3ML SOPN Inject 2 mg as directed once a week.   sulfamethoxazole -trimethoprim  (BACTRIM  DS) 800-160 MG tablet Take 1 tablet by mouth 2 (two) times daily.   tamsulosin (FLOMAX) 0.4 MG CAPS capsule Take by mouth.   [DISCONTINUED] meloxicam  (MOBIC ) 15 MG tablet Take 15 mg by mouth daily. (Patient not taking: Reported on 08/04/2024)   No facility-administered encounter medications on file as of 08/04/2024.    ALLERGIES: Allergies  Allergen Reactions   Farxiga [Dapagliflozin] Rash   Losartan Swelling, Palpitations and Rash   Peanut-Containing Drug Products Hives   Penicillins Itching and Rash    Tetracyclines & Related Rash    VACCINATION STATUS: Immunization History  Administered Date(s) Administered   PFIZER Comirnaty(Gray Top)Covid-19 Tri-Sucrose Vaccine 10/24/2019, 11/15/2019, 07/20/2020    Diabetes He presents for his follow-up diabetic visit. He has type 2 diabetes mellitus. Onset time: Diagnosed at approx age of 88. His disease course has been stable. There are no hypoglycemic associated symptoms. Associated symptoms include fatigue. Pertinent negatives for diabetes include no foot ulcerations, no polydipsia and no polyuria. There are no hypoglycemic complications. Symptoms are stable. Diabetic complications include nephropathy. Risk factors for coronary artery disease include diabetes mellitus, dyslipidemia, family history, male sex, obesity, hypertension and sedentary lifestyle. Current diabetic treatments: Ozempic  only. He is compliant with treatment most of the time. His weight is fluctuating minimally. He is following a generally unhealthy diet. When asked about meal planning, he reported none. He has had a previous visit with a dietitian (has seen dietician in distant past). His home blood glucose trend is fluctuating minimally. His breakfast blood glucose range is generally 140-180 mg/dl. (He presents today with his logs showing slightly above target fasting glycemic profile.  His POCT A1c today is 6.5%, increasing slightly from last visit of 6.2%.  He denies any hypoglycemia. He does get his Ozempic  from patient assistance through his PCP but this is stopping starting in January, will have to get from his pharmacy.  ) An ACE inhibitor/angiotensin II receptor blocker is not being taken. He does not see a podiatrist.Eye exam is current.     Review of systems  Constitutional: + stable body weight,  current Body mass index is 41.93 kg/m. , no fatigue, no subjective hyperthermia, no subjective hypothermia Eyes: no blurry vision, no xerophthalmia ENT: no sore throat, no nodules  palpated in throat, no dysphagia/odynophagia, no hoarseness Cardiovascular: no chest pain, no shortness of breath, no palpitations, no leg swelling Respiratory: no cough, no shortness of breath Gastrointestinal: no nausea/vomiting/diarrhea Musculoskeletal: + generalized muscle aches- walks with cane Skin: no rashes, no hyperemia Neurological: no tremors, no numbness, no tingling, no dizziness Psychiatric: no depression, no anxiety  Objective:     BP 108/78 (BP Location: Right Arm, Patient Position: Sitting, Cuff Size: Large)   Pulse 71   Ht 5' 11 (1.803 m)   Wt (!) 300 lb 9.6 oz (136.4 kg)   BMI 41.93 kg/m   Wt Readings from Last 3 Encounters:  08/04/24 (!) 300 lb 9.6  oz (136.4 kg)  01/28/24 (!) 303 lb 9.6 oz (137.7 kg)  09/30/23 (!) 301 lb 6.4 oz (136.7 kg)     BP Readings from Last 3 Encounters:  08/04/24 108/78  01/28/24 120/70  09/30/23 112/69     Physical Exam- Limited  Constitutional:  Body mass index is 41.93 kg/m. , not in acute distress, distracted state of mind with frequent interruptions Musculoskeletal: no gross deformities, strength intact in all four extremities, no gross restriction of joint movements, walks with cane Skin:  no rashes, no hyperemia Neurological: no tremor with outstretched hands   Diabetic Foot Exam - Simple   No data filed     CMP ( most recent) CMP     Component Value Date/Time   BUN 21 06/04/2023 0000   CREATININE 1.6 (A) 06/04/2023 0000   ALKPHOS 131 (A) 11/09/2020 0000   GFRNONAA 40 11/09/2020 0000     Diabetic Labs (most recent): Lab Results  Component Value Date   HGBA1C 6.5 (A) 08/04/2024   HGBA1C 6.2 (A) 01/28/2024   HGBA1C 6.6 06/04/2023     Lipid Panel ( most recent) Lipid Panel     Component Value Date/Time   TRIG 201 (A) 06/04/2023 0000   LDLCALC 66 06/04/2023 0000      Lab Results  Component Value Date   TSH 2.85 06/04/2023           Assessment & Plan:   1) Type 2 diabetes mellitus with  stage 3b chronic kidney disease, without long-term current use of insulin (HCC)  He presents today with his logs showing slightly above target fasting glycemic profile.  His POCT A1c today is 6.5%, increasing slightly from last visit of 6.2%.  He denies any hypoglycemia. He does get his Ozempic  from patient assistance through his PCP but this is stopping starting in January, will have to get from his pharmacy.    - Adam Grant has currently uncontrolled symptomatic type 2 DM since 68 years of age.   -Recent labs reviewed.  - I had a long discussion with him about the progressive nature of diabetes and the pathology behind its complications. -his diabetes is complicated by nonhealing diabetic foot ulcer, PAD, CKD, retinopathy and he remains at a high risk for more acute and chronic complications which include CAD, CVA, CKD, retinopathy, and neuropathy. These are all discussed in detail with him.  - Nutritional counseling repeated/built upon at each appointment.  - The patient admits there is a room for improvement in their diet and drink choices. -  Suggestion is made for the patient to avoid simple carbohydrates from their diet including Cakes, Sweet Desserts / Pastries, Ice Cream, Soda (diet and regular), Sweet Tea, Candies, Chips, Cookies, Sweet Pastries, Store Bought Juices, Alcohol in Excess of 1-2 drinks a day, Artificial Sweeteners, Coffee Creamer, and Sugar-free Products. This will help patient to have stable blood glucose profile and potentially avoid unintended weight gain.   - I encouraged the patient to switch to unprocessed or minimally processed complex starch and increased protein intake (animal or plant source), fruits, and vegetables.   - Patient is advised to stick to a routine mealtimes to eat 3 meals a day and avoid unnecessary snacks (to snack only to correct hypoglycemia).  - I have approached him with the following individualized plan to manage his diabetes and patient  agrees:   -He is advised to continue his Ozempic  2 mg SQ weekly (he gets patient assistance through his PCP for this medication  for a bit longer).    -He is encouraged to continue monitoring blood glucose once daily, at breakfast, and to call the clinic if he has readings less than 70 or above 300 for 3 tests in a row.  - Adjustment parameters are given to him for hypo and hyperglycemia in writing.  - he is not a candidate for Metformin due to concurrent renal insufficiency.  - he did not tolerate Metformin, Rybelsus , Tradjenta, or Farxiga in the past.  - Specific targets for  A1c; LDL, HDL, and Triglycerides were discussed with the patient.  2) Blood Pressure /Hypertension:  his blood pressure is controlled to target.   he is advised to continue his current medications as prescribed by his PCP.  3) Lipids/Hyperlipidemia:    His recent lipid panel from 06/04/23 shows controlled LDL of 66.  he is advised to continue Lipitor 40 mg daily at bedtime.  Side effects and precautions discussed with him.  He has annual physical with labs coming up with PCP in November.  I asked that he have labs sent to use for record keeping.  4)  Weight/Diet:  his Body mass index is 41.93 kg/m.  -  clearly complicating his diabetes care.   he is a candidate for weight loss. I discussed with him the fact that loss of 5 - 10% of his  current body weight will have the most impact on his diabetes management.  Exercise, and detailed carbohydrates information provided  -  detailed on discharge instructions.  5) Chronic Care/Health Maintenance: -he is not on ACEI/ARB and is on Statin medications and is encouraged to initiate and continue to follow up with Ophthalmology, Dentist, Podiatrist at least yearly or according to recommendations, and advised to stay away from smoking. I have recommended yearly flu vaccine and pneumonia vaccine at least every 5 years; moderate intensity exercise for up to 150 minutes weekly; and  sleep for at least 7 hours a day.  - he is advised to maintain close follow up with Trudy Vaughn FALCON, MD for primary care needs, as well as his other providers for optimal and coordinated care.      I spent  51  minutes in the care of the patient today including review of labs from CMP, Lipids, Thyroid  Function, Hematology (current and previous including abstractions from other facilities); face-to-face time discussing  his blood glucose readings/logs, discussing hypoglycemia and hyperglycemia episodes and symptoms, medications doses, his options of short and long term treatment based on the latest standards of care / guidelines;  discussion about incorporating lifestyle medicine;  and documenting the encounter. Risk reduction counseling performed per USPSTF guidelines to reduce obesity and cardiovascular risk factors.     Please refer to Patient Instructions for Blood Glucose Monitoring and Insulin/Medications Dosing Guide  in media tab for additional information. Please  also refer to  Patient Self Inventory in the Media  tab for reviewed elements of pertinent patient history.  Alm JONELLE Lever participated in the discussions, expressed understanding, and voiced agreement with the above plans.  All questions were answered to his satisfaction. he is encouraged to contact clinic should he have any questions or concerns prior to his return visit.     Follow up plan: - Return in about 6 months (around 02/02/2025) for Diabetes F/U with A1c in office, No previsit labs.   Benton Rio, [facility] [address] [place], [place] [address] Phone: [phone] Fax: [phone]  08/04/2024, 10:30 AM

## 2024-08-05 ENCOUNTER — Encounter (HOSPITAL_COMMUNITY): Payer: Self-pay

## 2024-08-07 ENCOUNTER — Ambulatory Visit (HOSPITAL_COMMUNITY)

## 2024-08-07 ENCOUNTER — Encounter (HOSPITAL_COMMUNITY): Payer: Self-pay | Admitting: Gastroenterology

## 2024-08-07 ENCOUNTER — Encounter (HOSPITAL_COMMUNITY)

## 2024-08-07 ENCOUNTER — Ambulatory Visit (HOSPITAL_COMMUNITY)
Admission: RE | Admit: 2024-08-07 | Discharge: 2024-08-07 | Disposition: A | Attending: Gastroenterology | Admitting: Gastroenterology

## 2024-08-07 ENCOUNTER — Encounter (HOSPITAL_COMMUNITY): Admission: RE | Disposition: A | Payer: Self-pay | Source: Home / Self Care | Attending: Gastroenterology

## 2024-08-07 DIAGNOSIS — D122 Benign neoplasm of ascending colon: Secondary | ICD-10-CM | POA: Diagnosis not present

## 2024-08-07 DIAGNOSIS — Z1211 Encounter for screening for malignant neoplasm of colon: Secondary | ICD-10-CM

## 2024-08-07 DIAGNOSIS — K621 Rectal polyp: Secondary | ICD-10-CM

## 2024-08-07 HISTORY — PX: COLONOSCOPY: SHX5424

## 2024-08-07 LAB — GLUCOSE, CAPILLARY: Glucose-Capillary: 115 mg/dL — ABNORMAL HIGH (ref 70–99)

## 2024-08-07 SURGERY — COLONOSCOPY
Anesthesia: General

## 2024-08-07 MED ORDER — PROPOFOL 500 MG/50ML IV EMUL
INTRAVENOUS | Status: DC | PRN
  Administered 2024-08-07: 100 mg via INTRAVENOUS
  Administered 2024-08-07: 150 ug/kg/min via INTRAVENOUS

## 2024-08-07 MED ORDER — LACTATED RINGERS IV SOLN: INTRAVENOUS | Status: DC | PRN

## 2024-08-07 NOTE — Anesthesia Postprocedure Evaluation (Signed)
 Anesthesia Post Note  Patient: Adam Grant  Procedure(s) Performed: COLONOSCOPY  Patient location during evaluation: PACU Anesthesia Type: General Level of consciousness: awake and alert Pain management: pain level controlled Vital Signs Assessment: post-procedure vital signs reviewed and stable Respiratory status: spontaneous breathing, nonlabored ventilation, respiratory function stable and patient connected to nasal cannula oxygen Cardiovascular status: blood pressure returned to baseline and stable Postop Assessment: no apparent nausea or vomiting Anesthetic complications: no   No notable events documented.   Last Vitals:  Vitals:   08/07/24 0958 08/07/24 1131  BP: 116/66 (!) 99/48  Pulse: 64 66  Resp: 20 12  Temp: 36.6 C (!) 36.3 C  SpO2: 98% 98%    Last Pain:  Vitals:   08/07/24 1131  TempSrc: Oral  PainSc: 0-No pain                 Andrea Limes

## 2024-08-07 NOTE — Anesthesia Preprocedure Evaluation (Addendum)
 Anesthesia Evaluation  Patient identified by MRN, date of birth, ID band Patient awake    Reviewed: Allergy & Precautions, H&P , NPO status , Patient's Chart, lab work & pertinent test results  Airway Mallampati: III  TM Distance: >3 FB Neck ROM: Full    Dental no notable dental hx.    Pulmonary neg pulmonary ROS, sleep apnea    Pulmonary exam normal breath sounds clear to auscultation       Cardiovascular hypertension, negative cardio ROS Normal cardiovascular exam Rhythm:Regular Rate:Normal     Neuro/Psych  PSYCHIATRIC DISORDERS  Depression    negative neurological ROS  negative psych ROS   GI/Hepatic negative GI ROS, Neg liver ROS,,,  Endo/Other  negative endocrine ROSdiabetes    Renal/GU negative Renal ROS  negative genitourinary   Musculoskeletal negative musculoskeletal ROS (+)    Abdominal   Peds negative pediatric ROS (+)  Hematology negative hematology ROS (+)   Anesthesia Other Findings   Reproductive/Obstetrics negative OB ROS                              Anesthesia Physical Anesthesia Plan  ASA: 3  Anesthesia Plan: General   Post-op Pain Management:    Induction: Intravenous  PONV Risk Score and Plan:   Airway Management Planned: Nasal Cannula  Additional Equipment:   Intra-op Plan:   Post-operative Plan:   Informed Consent: I have reviewed the patients History and Physical, chart, labs and discussed the procedure including the risks, benefits and alternatives for the proposed anesthesia with the patient or authorized representative who has indicated his/her understanding and acceptance.     Dental advisory given  Plan Discussed with: CRNA  Anesthesia Plan Comments:          Anesthesia Quick Evaluation

## 2024-08-07 NOTE — H&P (Signed)
 Adam Grant is an 68 y.o. male.   Chief Complaint: Colorectal cancer screening HPI: 68 year old male with past medical history of diabetes, hypertension and OSA, coming for colorectal cancer screening.  Last colonoscopy was performed 10 years ago per Patient, but no reports are available.  The patient denies having any complaints such as melena, hematochezia, abdominal pain or distention, change in her bowel movement consistency or frequency, no changes in weight recently.  No family history of colorectal cancer.   Past Medical History:  Diagnosis Date   Diabetes mellitus without complication (HCC)    Hypertension    Obstructive sleep apnea     Past Surgical History:  Procedure Laterality Date   ELBOW SURGERY     EYE SURGERY Bilateral    1978, 2001   SPINE SURGERY     Spine Cancer 2019    Family History  Problem Relation Age of Onset   Cancer Mother    Hypertension Mother    Hyperlipidemia Mother    Hypertension Father    Cancer Father    Heart attack Father    Heart failure Father    Social History:  reports that he has never smoked. He has been exposed to tobacco smoke. He has never used smokeless tobacco. He reports current alcohol use. He reports that he does not use drugs.  Allergies:  Allergies  Allergen Reactions   Farxiga [Dapagliflozin] Rash   Losartan Swelling, Palpitations and Rash   Peanut-Containing Drug Products Hives   Penicillins Itching and Rash   Tetracyclines & Related Rash    Medications Prior to Admission  Medication Sig Dispense Refill   acetaminophen (TYLENOL) 325 MG tablet Take by mouth.     amLODipine (NORVASC) 10 MG tablet      aspirin EC 81 MG tablet Take by mouth.     atorvastatin (LIPITOR) 40 MG tablet Take by mouth.     Cholecalciferol (VITAMIN D-3 PO) Take 1,000 mg by mouth daily.     fluticasone (FLONASE) 50 MCG/ACT nasal spray Place 1 spray into both nostrils daily.     labetalol (NORMODYNE) 100 MG tablet Take 200 mg by mouth 2  (two) times daily.     Multiple Vitamins-Minerals (COMPLETE) TABS Take by mouth.     oxycodone (OXY-IR) 5 MG capsule Take 5 mg by mouth as needed.     tamsulosin (FLOMAX) 0.4 MG CAPS capsule Take by mouth.     ACCU-CHEK GUIDE test strip Use to monitor glucose 4 times daily, before meals and before bed. 200 strip 3   Accu-Chek Softclix Lancets lancets Use as instructed to monitor glucose 4 times daily 100 each 12   hydrochlorothiazide (HYDRODIURIL) 25 MG tablet Take by mouth.     Semaglutide , 2 MG/DOSE, 8 MG/3ML SOPN Inject 2 mg as directed once a week. 3 mL 3   sulfamethoxazole -trimethoprim  (BACTRIM  DS) 800-160 MG tablet Take 1 tablet by mouth 2 (two) times daily.      Results for orders placed or performed during the hospital encounter of 08/07/24 (from the past 48 hours)  Glucose, capillary     Status: Abnormal   Collection Time: 08/07/24  9:59 AM  Result Value Ref Range   Glucose-Capillary 115 (H) 70 - 99 mg/dL    Comment: Glucose reference range applies only to samples taken after fasting for at least 8 hours.   No results found.  Review of Systems  All other systems reviewed and are negative.   Blood pressure 116/66, pulse 64, temperature 97.9  F (36.6 C), temperature source Oral, resp. rate 20, height 5' 11 (1.803 m), weight 136.1 kg, SpO2 98%. Physical Exam  GENERAL: The patient is AO x3, in no acute distress. HEENT: Head is normocephalic and atraumatic. EOMI are intact. Mouth is well hydrated and without lesions. NECK: Supple. No masses LUNGS: Clear to auscultation. No presence of rhonchi/wheezing/rales. Adequate chest expansion HEART: RRR, normal s1 and s2. ABDOMEN: Soft, nontender, no guarding, no peritoneal signs, and nondistended. BS +. No masses. EXTREMITIES: Without any cyanosis, clubbing, rash, lesions or edema. NEUROLOGIC: AOx3, no focal motor deficit. SKIN: no jaundice, no rashes  Assessment/Plan 68 year old male with past medical history of diabetes,  hypertension and OSA, coming for colorectal cancer screening.  Will proceed with colonoscopy.  Toribio Eartha Flavors, MD 08/07/2024, 10:05 AM

## 2024-08-07 NOTE — Discharge Instructions (Addendum)
 You are being discharged to home.  Resume your previous diet.  We are waiting for your pathology results.  Your physician has recommended a repeat colonoscopy for surveillance based on pathology results.

## 2024-08-07 NOTE — Op Note (Signed)
 [facility] Patient Name: Adam Grant Procedure Date: 08/07/2024 10:37 AM MRN: [id number] Date of Birth: July 03, 1956 Attending MD: Toribio Fortune , , [id number] CSN: [id number] Age: 68 Admit Type: Outpatient Procedure:                Colonoscopy Indications:              Screening for colorectal malignant neoplasm Providers:                Toribio Fortune, Olam Ada, RN, Bascom Blush Referring MD:              Medicines:                Monitored Anesthesia Care Complications:            No immediate complications. Estimated Blood Loss:     Estimated blood loss: none. Procedure:                Pre-Anesthesia Assessment:                           - Prior to the procedure, a History and Physical                            was performed, and patient medications, allergies                            and sensitivities were reviewed. The patient's                            tolerance of previous anesthesia was reviewed.                           - The risks and benefits of the procedure and the                            sedation options and risks were discussed with the                            patient. All questions were answered and informed                            consent was obtained.                           - ASA Grade Assessment: II - A patient with mild                            systemic disease.                           After obtaining informed consent, the colonoscope                            was passed under direct vision. Throughout the                            procedure, the patient's blood  pressure, pulse, and                            oxygen saturations were monitored continuously. The                            [id number]-[id number] ([id number]) Peds Colon was introduced                            through the anus and advanced to the the cecum,                            identified by appendiceal orifice and ileocecal                            valve. The colonoscopy  was performed without                            difficulty. The patient tolerated the procedure                            well. The quality of the bowel preparation was good. Scope In: 11:00:25 AM Scope Out: 11:28:26 AM Scope Withdrawal Time: 0 hours 21 minutes 37 seconds  Total Procedure Duration: 0 hours 28 minutes 1 second  Findings:      The perianal and digital rectal examinations were normal.      Two sessile polyps were found in the ascending colon. The polyps were 3       to 5 mm in size. These polyps were removed with a cold snare. Resection       and retrieval were complete.      A 2 mm polyp was found in the rectum. The polyp was sessile. The polyp       was removed with a cold snare. Resection and retrieval were complete.      The retroflexed view of the distal rectum and anal verge was normal and       showed no anal or rectal abnormalities. Impression:               - Two 3 to 5 mm polyps in the ascending colon,                            removed with a cold snare. Resected and retrieved.                           - One 2 mm polyp in the rectum, removed with a cold                            snare. Resected and retrieved.                           - The distal rectum and anal verge are normal on                            retroflexion view. Moderate Sedation:  Per Anesthesia Care Recommendation:           - Discharge patient to home (ambulatory).                           - Resume previous diet.                           - Await pathology results.                           - Repeat colonoscopy for surveillance based on                            pathology results. Procedure Code(s):        --- Professional ---                           [phone], Colonoscopy, flexible; with removal of                            tumor(s), polyp(s), or other lesion(s) by snare                            technique Diagnosis Code(s):        --- Professional ---                            Z12.11, Encounter for screening for malignant                            neoplasm of colon                           D12.2, Benign neoplasm of ascending colon                           D12.8, Benign neoplasm of rectum CPT copyright 2022 [employer]. All rights reserved. The codes documented in this report are preliminary and upon coder review may  be revised to meet current compliance requirements. Toribio Fortune, MD Toribio Fortune,  08/07/2024 11:29:10 AM This report has been signed electronically. Number of Addenda: 0

## 2024-08-07 NOTE — Transfer of Care (Signed)
 Immediate Anesthesia Transfer of Care Note  Patient: Adam Grant  Procedure(s) Performed: COLONOSCOPY  Patient Location: PACU  Anesthesia Type:MAC  Level of Consciousness: awake, alert , and oriented  Airway & Oxygen Therapy: Patient Spontanous Breathing  Post-op Assessment: Report given to RN and Post -op Vital signs reviewed and stable  Post vital signs: Reviewed and stable  Last Vitals:  Vitals Value Taken Time  BP    Temp    Pulse    Resp    SpO2      Last Pain:  Vitals:   08/07/24 0958  TempSrc: Oral  PainSc: 0-No pain      Patients Stated Pain Goal: 6 (08/07/24 0958)  Complications: No notable events documented.

## 2024-08-10 ENCOUNTER — Encounter (HOSPITAL_COMMUNITY): Payer: Self-pay | Admitting: Gastroenterology

## 2024-08-10 ENCOUNTER — Ambulatory Visit (INDEPENDENT_AMBULATORY_CARE_PROVIDER_SITE_OTHER): Payer: Self-pay | Admitting: Gastroenterology

## 2024-08-10 LAB — SURGICAL PATHOLOGY

## 2024-08-13 NOTE — Progress Notes (Signed)
 7 yr TCS noted in recall Patient result letter mailed procedure note and pathology result faxed to PCP

## 2024-10-12 ENCOUNTER — Ambulatory Visit: Admitting: Pulmonary Disease

## 2025-02-03 ENCOUNTER — Ambulatory Visit: Admitting: Nurse Practitioner
